# Patient Record
Sex: Male | Born: 1935 | Race: White | Hispanic: No | Smoking: Never smoker
Health system: Southern US, Community
[De-identification: ages and names within clinical notes are randomized; demographics above are authoritative.]

## PROBLEM LIST (undated history)

## (undated) DIAGNOSIS — H9193 Unspecified hearing loss, bilateral: Secondary | ICD-10-CM

## (undated) DIAGNOSIS — E785 Hyperlipidemia, unspecified: Secondary | ICD-10-CM

## (undated) DIAGNOSIS — I10 Essential (primary) hypertension: Secondary | ICD-10-CM

## (undated) DIAGNOSIS — R7303 Prediabetes: Secondary | ICD-10-CM

## (undated) DIAGNOSIS — N400 Enlarged prostate without lower urinary tract symptoms: Secondary | ICD-10-CM

## (undated) DIAGNOSIS — I4891 Unspecified atrial fibrillation: Secondary | ICD-10-CM

## (undated) DIAGNOSIS — D414 Neoplasm of uncertain behavior of bladder: Secondary | ICD-10-CM

## (undated) DIAGNOSIS — N320 Bladder-neck obstruction: Secondary | ICD-10-CM

## (undated) DIAGNOSIS — J189 Pneumonia, unspecified organism: Secondary | ICD-10-CM

## (undated) DIAGNOSIS — I2510 Atherosclerotic heart disease of native coronary artery without angina pectoris: Secondary | ICD-10-CM

## (undated) HISTORY — DX: Hyperlipidemia, unspecified: E78.5

## (undated) HISTORY — DX: Atherosclerotic heart disease of native coronary artery without angina pectoris: I25.10

## (undated) HISTORY — DX: Essential (primary) hypertension: I10

## (undated) HISTORY — PX: MOUTH SURGERY: SHX715

## (undated) HISTORY — PX: BUNIONECTOMY: SHX129

## (undated) HISTORY — PX: CORONARY ARTERY BYPASS GRAFT: SHX141

## (undated) HISTORY — PX: OTHER SURGICAL HISTORY: SHX169

---
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
## 1961-09-01 DIAGNOSIS — D414 Neoplasm of uncertain behavior of bladder: Secondary | ICD-10-CM

## 1961-09-01 HISTORY — DX: Neoplasm of uncertain behavior of bladder: D41.4

## 2010-09-01 HISTORY — PX: US ECHOCARDIOGRAPHY: HXRAD669

## 2010-09-01 HISTORY — PX: HERNIA REPAIR: SHX51

## 2011-08-04 ENCOUNTER — Ambulatory Visit (INDEPENDENT_AMBULATORY_CARE_PROVIDER_SITE_OTHER): Payer: Medicare Other | Admitting: Family Medicine

## 2011-08-04 ENCOUNTER — Encounter: Payer: Self-pay | Admitting: Family Medicine

## 2011-08-04 DIAGNOSIS — R7301 Impaired fasting glucose: Secondary | ICD-10-CM

## 2011-08-04 DIAGNOSIS — I10 Essential (primary) hypertension: Secondary | ICD-10-CM

## 2011-08-04 DIAGNOSIS — E785 Hyperlipidemia, unspecified: Secondary | ICD-10-CM | POA: Insufficient documentation

## 2011-08-04 DIAGNOSIS — I2510 Atherosclerotic heart disease of native coronary artery without angina pectoris: Secondary | ICD-10-CM

## 2011-08-04 MED ORDER — ATENOLOL-CHLORTHALIDONE 50-25 MG PO TABS: 1.0000 | ORAL_TABLET | Freq: Every day | ORAL | Status: DC

## 2011-08-04 MED ORDER — PRAVASTATIN SODIUM 40 MG PO TABS: 60.0000 mg | ORAL_TABLET | Freq: Every day | ORAL | Status: DC

## 2011-08-04 NOTE — Progress Notes (Signed)
  Subjective:    Patient ID: Brian Manning, male    DOB: 1928/01/30, 77 y.o.   MRN: [id number]  HPI Here to establish care.  Will continue to get care at [facility] with Dr. Francene Castle.  He wanted a local PCP as well.  No problems, continues to have mild BPH which causes night time awakenings.  States has had mildly enlarged prostate on multiple exams throughout the past years with no abnormality or nodularity.  Declines medical tx  And feels it does not impact his quality of life.  Take diuretic during the day and does fluid restrict before bed.   Review of Systems Gen:  No fever, chills, unexplained weight loss Throat:  No sore throat or dysphagia CV:  No chest pain, palpitations, PND, dyspnea on exertion, or edema Resp: No cough, dyspnea, wheezing Abd: No nausea, vomting, diarrhea, constipation, or change in bowel color, size, or caliber. MSK: no joint pain, myalgias SKIN: no rash, changing moles GU: No dysuria, hematuria Neuro:  No headache, numbness, weakness, tingling, syncope. Psych:  Screening for depression neg      Objective:   Physical Exam GEN: Alert & Oriented, No acute distress HEENT: [place]/AT. EOMI, PERRLA, no conjunctival injection or scleral icterus.  Bilateral tympanic membranes intact without erythema or effusion.  .  Nares without edema or rhinorrhea.  Oropharynx is without erythema or exudates.  No anterior or posterior cervical lymphadenopathy.  Neck supple no thyromegaly. CV:  Regular Rate & Rhythm, no murmur Respiratory:  Normal work of breathing, CTAB Abd:  + BS, soft, no tenderness to palpation Ext: no pre-tibial edema Neuro: CN 2-12 grossly intact.  Strength 5/5 upper and lower extremities.        Assessment & Plan:  Preventive care:  Never had a colonoscopy but after discussion will not pursue any screening at his age and likely life expectancy.  Also will not do PSA and declines DEXA.  He is UTD on vaccinations.  Tetanus 1008 Pneumo 2009 Flu  2012   Will continue to get care at [facility] as well.  Recommended follow-up every 6-12 months.

## 2011-08-04 NOTE — Patient Instructions (Signed)
Very nice to meet you Return for fasting labwork- no food or drink except water for 8 hours I will send you a letter in the mail if all is normal, otherwise will call you to discuss Follow-up in 6-12 months

## 2011-08-04 NOTE — Assessment & Plan Note (Signed)
Will check fasting labwork 

## 2011-08-04 NOTE — Assessment & Plan Note (Addendum)
Close to goal of 130/80 today ,reasonable given his age.  No changes in regimen.  Will check cr and electrolytes

## 2011-09-10 ENCOUNTER — Other Ambulatory Visit: Payer: Medicare Other

## 2011-09-10 DIAGNOSIS — E785 Hyperlipidemia, unspecified: Secondary | ICD-10-CM

## 2011-09-10 LAB — COMPREHENSIVE METABOLIC PANEL
AST: 17 U/L (ref 0–37)
Albumin: 4 g/dL (ref 3.5–5.2)
BUN: 18 mg/dL (ref 6–23)
CO2: 27 mEq/L (ref 19–32)
Calcium: 9.2 mg/dL (ref 8.4–10.5)
Chloride: 99 mEq/L (ref 96–112)
Creat: 0.95 mg/dL (ref 0.50–1.35)
Glucose, Bld: 119 mg/dL — ABNORMAL HIGH (ref 70–99)
Potassium: 3.6 mEq/L (ref 3.5–5.3)

## 2011-09-10 LAB — LIPID PANEL
Cholesterol: 125 mg/dL (ref 0–200)
HDL: 40 mg/dL (ref >39)
Triglycerides: 66 mg/dL (ref <150)

## 2011-09-10 NOTE — Progress Notes (Signed)
cmp and flp done today Brian Manning 

## 2011-09-11 ENCOUNTER — Encounter: Payer: Self-pay | Admitting: Family Medicine

## 2011-09-11 ENCOUNTER — Telehealth: Payer: Self-pay | Admitting: Family Medicine

## 2011-09-11 DIAGNOSIS — R7301 Impaired fasting glucose: Secondary | ICD-10-CM | POA: Insufficient documentation

## 2011-09-11 NOTE — Telephone Encounter (Signed)
Left message to call back if has questions about labs- i have sent a sumamry to him in the mail

## 2011-09-11 NOTE — Assessment & Plan Note (Signed)
Recheck in 6-12 months.  

## 2012-09-01 HISTORY — PX: OTHER SURGICAL HISTORY: SHX169

## 2014-06-13 ENCOUNTER — Ambulatory Visit (INDEPENDENT_AMBULATORY_CARE_PROVIDER_SITE_OTHER): Payer: Medicare Other | Admitting: Family Medicine

## 2014-06-13 ENCOUNTER — Encounter: Payer: Self-pay | Admitting: Family Medicine

## 2014-06-13 ENCOUNTER — Ambulatory Visit: Payer: Medicare Other | Admitting: Home Health Services

## 2014-06-13 VITALS — BP 129/78 | HR 58 | Temp 97.6°F | Wt 196.5 lb

## 2014-06-13 DIAGNOSIS — Z1211 Encounter for screening for malignant neoplasm of colon: Secondary | ICD-10-CM

## 2014-06-13 DIAGNOSIS — Z0000 Encounter for general adult medical examination without abnormal findings: Secondary | ICD-10-CM

## 2014-06-13 DIAGNOSIS — IMO0001 Reserved for inherently not codable concepts without codable children: Secondary | ICD-10-CM

## 2014-06-13 DIAGNOSIS — Z23 Encounter for immunization: Secondary | ICD-10-CM

## 2014-06-13 LAB — BASIC METABOLIC PANEL
BUN: 16 mg/dL (ref 6–23)
CHLORIDE: 101 meq/L (ref 96–112)
CO2: 29 mEq/L (ref 19–32)
CREATININE: 0.87 mg/dL (ref 0.50–1.35)
Calcium: 9.2 mg/dL (ref 8.4–10.5)
Glucose, Bld: 106 mg/dL — ABNORMAL HIGH (ref 70–99)
POTASSIUM: 4.2 meq/L (ref 3.5–5.3)
Sodium: 138 mEq/L (ref 135–145)

## 2014-06-13 LAB — LIPID PANEL
CHOLESTEROL: 106 mg/dL (ref 0–200)
HDL: 44 mg/dL (ref >39)
LDL CALC: 52 mg/dL (ref 0–99)
TRIGLYCERIDES: 51 mg/dL (ref <150)
Total CHOL/HDL Ratio: 2.4 Ratio
VLDL: 10 mg/dL (ref 0–40)

## 2014-06-13 NOTE — Patient Instructions (Signed)
Colonoscopy °A colonoscopy is an exam to look at your colon. This exam can help find lumps (tumors), growths (polyps), bleeding, and redness and puffiness (inflammation) in your colon.  °BEFORE THE PROCEDURE °· Ask your doctor about changing or stopping your regular medicines. °· You may need to drink a large amount of a special liquid (oral bowel prep). You start drinking this the day before your procedure. It will cause you to have watery poop (stool). This cleans out your colon. °· Do not eat or drink anything else once you have started the bowel prep, unless your doctor tells you it is safe to do so. °· Make plans for someone to drive you home after the procedure. °PROCEDURE °· You will be given medicine to help you relax (sedative). °· You will lie on your side with your knees bent. °· A tube with a camera on the end is put in the opening of your butt (anus) and into your colon. Pictures are sent to a computer screen. Your doctor will look for anything that is not normal. °· Your doctor may take a tissue sample (biopsy) from your colon to be looked at more closely. °· The exam is finished when your doctor has viewed all of the colon. °AFTER THE PROCEDURE °· Do not drive for 24 hours after the exam. °· You may have a small amount of blood in your poop. This is normal. °· You may pass gas and have belly (abdominal) cramps. This is normal. °· Ask when your test results will be ready. Make sure you get your test results. °Document Released: 09/20/2010 Document Revised: 08/23/2013 Document Reviewed: 04/25/2013 °ExitCare® Patient Information ©2015 [employer]. This information is not intended to replace advice given to you by your health care provider. Make sure you discuss any questions you have with your health care provider. ° °

## 2014-06-13 NOTE — Progress Notes (Signed)
Patient ID: Brian Manning, male   DOB: [birth year], 80 y.o.   MRN: [id number] Subjective:     Brian Manning is a 80 y.o. male and is here for a comprehensive physical exam. The patient reports Increase urine frequency was started on Cadura,helping some. Also right shoulder pain due to DJD for which he is scheduled for consultation Nov 18.  History   Social History  . Marital Status: Unknown    Spouse Name: N/A    Number of Children: N/A  . Years of Education: N/A   Occupational History  . retired    Social History Main Topics  . Smoking status: Not on file  . Smokeless tobacco: Never Used  . Alcohol Use: Not on file  . Drug Use: Not on file  . Sexual Activity: Not on file   Other Topics Concern  . Not on file   Social History Narrative   Retired Engineer, maintenance (IT).  Divorced- ex wife now passed away.  Lives alone- has two children in [place].  College graduate.   Health Maintenance  Topic Date Due  . Tetanus/tdap  12/24/1948  . Colonoscopy  12/25/1979  . Zostavax  12/24/1989  . Pneumococcal Polysaccharide Vaccine Age 105 And Over  12/25/1994  . Influenza Vaccine  04/01/2014    The following portions of the patient's history were reviewed and updated as appropriate: allergies, current medications, past family history, past medical history, past social history, past surgical history and problem list.  Review of Systems Pertinent items are noted in HPI.   Objective:    BP 129/78  Pulse 58  Temp(Src) 97.6 F (36.4 C)  Wt 196 lb 8 oz (89.132 kg) General appearance: alert and cooperative Head: Normocephalic, without obvious abnormality, atraumatic Eyes: conjunctivae/corneas clear. PERRL, EOM's intact. Fundi benign. Ears: normal TM's and external ear canals both ears Throat: lips, mucosa, and tongue normal; teeth and gums normal Neck: no adenopathy, no carotid bruit, no JVD, supple, symmetrical, trachea midline and thyroid not enlarged, symmetric, no  tenderness/mass/nodules Back: symmetric, no curvature. ROM normal. No CVA tenderness. Lungs: clear to auscultation bilaterally Heart: regular rate and rhythm, S1, S2 normal, no murmur, click, rub or gallop Abdomen: soft, non-tender; bowel sounds normal; no masses,  no organomegaly Rectal: deferred Extremities: extremities normal, atraumatic, no cyanosis or edema Skin: Skin color, texture, turgor normal. No rashes or lesions Lymph nodes: Cervical, supraclavicular, and axillary nodes normal. Neurologic: Alert and oriented X 3, normal strength and tone. Normal symmetric reflexes. Normal coordination and gait    Assessment:    Healthy male exam. Normal exam      Plan:  Anticipatory guidance given. Flu shot given. He mentioned he got Prevnar in Sept 2015 at the [place] Has never had colonoscopy, he prefers not to get it done now that he is over 82. Stool card given for occult blood testing. He agreed to bring his stool back for testing. F/U with ortho as scheduled for right shoulder pain BMET and Lipid profile checked today. F/U in 6-12 months.    See After Visit Summary for Counseling Recommendations

## 2014-06-13 NOTE — Progress Notes (Signed)
Patient here for annual wellness visit, patient reports: Risk Factors/Conditions needing evaluation or treatment: Pt. Does not have have any new risk factors that need evaluation.  Home Safety: Pt lives at home, by self in a 1 story home.  Pt reports having smoke alarms and having adaptive equipment.  Other Information: Corrective lens: Pt wears reading corrective lens, has biannual eye exams at [facility].  Dentures: Pt does not have dentures, has annual dental exams. Memory: Pt denies memory problems.  Patient's Mini Mental Score (recorded in doc. flowsheet): 30 Bladder:  Pt denies problems with bladder control.  BMI/Exercise: We discussed BMI and strategies for weight maintenance including maintaining diet rich in fiber and vegetables.  We also discussed  maintining a regular exercise routine. Pt reports walking 2 miles every other day. Med Adherence:  We discussed importance of taking medications for htn.  Pt reports missing 0 day in the past week.  ADL/IADL:  Pt reports independence in all functions. Balance/Gait: Pt reports 0 falls in the past year.  We discussed home safety and fall prevention.   Hearing:  Pt reports some problems with hearing in left ear, is not interested in getting a hearing aid.   Balance Abnormal Patient value  Sitting balance    Sit to stand    Attempts to arise    Immediate standing balance    Standing balance    Nudge    Eyes closed- Romberg    Tandem stance    Back lean x abnormal  Neck Rotation    360 degree turn    Sitting down     Gait Abnormal Patient value  Initiation of gait    Step length-left    Step length-right    Step height-left    Step height-right    Step symmetry    Step continuity    Path deviation    Trunk movement    Walking stance        Annual Wellness Visit Requirements Recorded Today In  Medical, family, social history Past Medical, Family, Social History Section  Current providers Care team  Current medications  Medications  Wt, BP, Ht, BMI Vital signs  Hearing assessment (welcome visit) Progress Note  Tobacco, alcohol, illicit drug use History  ADL Nurse Assessment  Depression Screening Nurse Assessment  Cognitive impairment Document Flowsheet  Mini Mental Status Document Flowsheet  Fall Risk Fall/Depression  Home Safety Progress Note  End of Life Planning (welcome visit) Social Documentation  Medicare preventative services Health Maintenance  Risk factors/conditions needing evaluation/treatment Progress Note  Personalized health advice Patient Instructions, goals, letter  Diet & Exercise Social Documentation  Emergency Contact Social Documentation  Seat Belts Social Documentation  Sun exposure/protection Social Documentation

## 2014-06-14 ENCOUNTER — Telehealth: Payer: Self-pay | Admitting: Family Medicine

## 2014-06-14 NOTE — Telephone Encounter (Signed)
Message left. Lab test looks ok.

## 2014-06-15 ENCOUNTER — Encounter: Payer: Self-pay | Admitting: Family Medicine

## 2014-06-27 LAB — POC HEMOCCULT BLD/STL (HOME/3-CARD/SCREEN)
FECAL OCCULT BLD: NEGATIVE
FECAL OCCULT BLD: NEGATIVE
Fecal Occult Blood, POC: NEGATIVE

## 2014-06-27 NOTE — Addendum Note (Signed)
Addended by: Martinique, Marque Bango on: 06/27/2014 11:52 AM   Modules accepted: Orders

## 2014-12-19 ENCOUNTER — Other Ambulatory Visit: Payer: Self-pay | Admitting: Family Medicine

## 2014-12-19 ENCOUNTER — Telehealth: Payer: Self-pay | Admitting: Family Medicine

## 2014-12-19 DIAGNOSIS — M25511 Pain in right shoulder: Secondary | ICD-10-CM

## 2014-12-19 NOTE — Telephone Encounter (Signed)
After looking through chart there was no referral placed for ortho.  Will also send this message to MD to place referral. Brian Manning

## 2014-12-19 NOTE — Telephone Encounter (Signed)
Referral to orthopedic done.

## 2014-12-19 NOTE — Telephone Encounter (Signed)
Pt calling re: an upcoming surgery that is scheduled for shoulder replacement on 5/6. Pt has [place]. Dr. Esmond Plants with [facility] is doing the surgery. They need a referral from Dr. Gwendlyn Deutscher in order to do the surgery. Pt says he has been waiting for a year to do this surgery and would hate to have to post pone it any longer. Wants to know if we can process this asap with [facility], he says he is running out of time. Pt says we can speak to [place] at [place] at [phone]. Pt originally thought he could file this with the [facility] but was told it wouldn't be covered at [facility] so that is why this is so last minute.

## 2014-12-19 NOTE — Telephone Encounter (Signed)
Will forward to referral coordinator. Jazmin Hartsell,CMA  

## 2014-12-20 NOTE — Telephone Encounter (Signed)
[employer] referral done.

## 2014-12-26 NOTE — H&P (Signed)
  Brian Manning is an 81 y.o. male.    Chief Complaint: right shoulder pain  HPI: Pt is a 81 y.o. male complaining of right shoulder pain for multiple years. Pain had continually increased since the beginning. X-rays in the clinic show end-stage arthritic changes of the right shoulder. Pt has tried various conservative treatments which have failed to alleviate their symptoms, including injections and therapy. Various options are discussed with the patient. Risks, benefits and expectations were discussed with the patient. Patient understand the risks, benefits and expectations and wishes to proceed with surgery.   PCP:  Andrena Mews, MD  D/C Plans: Home  PMH: Past Medical History  Diagnosis Date  . Hyperlipidemia   . Hypertension   . Coronary artery disease     PSH: Past Surgical History  Procedure Laterality Date  . Hernia repair  2012  . US echocardiography  2012    pre-op for hernia: wnl  per patient  . Bladder polyp    . Left shoulder replacement Left 2014    Done at [place] History:  reports that he has never smoked. He has never used smokeless tobacco. He reports that he drinks about 4.2 oz of alcohol per week. He reports that he does not use illicit drugs.  Allergies:  No Known Allergies  Medications: No current facility-administered medications for this encounter.   Current Outpatient Prescriptions  Medication Sig Dispense Refill  . aspirin EC 81 MG tablet Take 81 mg by mouth daily.      Marland Kitchen atenolol (TENORMIN) 50 MG tablet Take 50 mg by mouth daily.    Marland Kitchen doxazosin (CARDURA) 2 MG tablet Take 2 mg by mouth daily.    . fish oil-omega-3 fatty acids 1000 MG capsule Take 1 g by mouth daily.     . folic acid (FOLVITE) 128 MCG tablet Take 400 mcg by mouth daily.      . Multiple Vitamin (MULTIVITAMIN) tablet Take 1 tablet by mouth daily.      . naproxen sodium (ANAPROX) 220 MG tablet Take 220 mg by mouth 2 (two) times daily as needed (pain).     . pravastatin  (PRAVACHOL) 40 MG tablet Take 40 mg by mouth daily.      No results found for this or any previous visit (from the past 48 hour(s)). No results found.  ROS: Pain with rom of the right upper extremity  Physical Exam:  Alert and oriented 81 y.o. male in no acute distress Cranial nerves 2-12 intact Cervical spine: full rom with no tenderness, nv intact distally Chest: active breath sounds bilaterally, no wheeze rhonchi or rales Heart: regular rate and rhythm, no murmur Abd: non tender non distended with active bowel sounds Hip is stable with rom  Right shoulder with limited rom and strength nv intact distally No rashes or edema Crepitus with rom  Assessment/Plan Assessment: right shoulder rotator cuff insufficiency  Plan: Patient will undergo a right reverse total shoulder by Dr. Veverly Fells at [facility]. Risks benefits and expectations were discussed with the patient. Patient understand risks, benefits and expectations and wishes to proceed.

## 2014-12-28 ENCOUNTER — Encounter (HOSPITAL_COMMUNITY)
Admission: RE | Admit: 2014-12-28 | Discharge: 2014-12-28 | Disposition: A | Discharge status: Home / Self Care | Payer: Commercial Managed Care - HMO | Source: Ambulatory Visit | Attending: Orthopedic Surgery | Admitting: Orthopedic Surgery

## 2014-12-28 ENCOUNTER — Encounter (HOSPITAL_COMMUNITY): Payer: Self-pay

## 2014-12-28 DIAGNOSIS — I10 Essential (primary) hypertension: Secondary | ICD-10-CM | POA: Insufficient documentation

## 2014-12-28 DIAGNOSIS — E785 Hyperlipidemia, unspecified: Secondary | ICD-10-CM | POA: Insufficient documentation

## 2014-12-28 DIAGNOSIS — Z01812 Encounter for preprocedural laboratory examination: Secondary | ICD-10-CM | POA: Insufficient documentation

## 2014-12-28 LAB — CBC
HCT: 42 % (ref 39.0–52.0)
HEMOGLOBIN: 14.4 g/dL (ref 13.0–17.0)
MCH: 32.7 pg (ref 26.0–34.0)
MCHC: 34.3 g/dL (ref 30.0–36.0)
MCV: 95.2 fL (ref 78.0–100.0)
Platelets: 199 10*3/uL (ref 150–400)
RBC: 4.41 MIL/uL (ref 4.22–5.81)
RDW: 12.8 % (ref 11.5–15.5)
WBC: 4.9 10*3/uL (ref 4.0–10.5)

## 2014-12-28 LAB — BASIC METABOLIC PANEL
ANION GAP: 6 (ref 5–15)
BUN: 17 mg/dL (ref 6–23)
CALCIUM: 9.1 mg/dL (ref 8.4–10.5)
CO2: 27 mmol/L (ref 19–32)
CREATININE: 0.87 mg/dL (ref 0.50–1.35)
Chloride: 103 mmol/L (ref 96–112)
GFR calc non Af Amer: 77 mL/min — ABNORMAL LOW (ref >90)
GFR, EST AFRICAN AMERICAN: 89 mL/min — AB (ref >90)
Glucose, Bld: 110 mg/dL — ABNORMAL HIGH (ref 70–99)
Potassium: 4.9 mmol/L (ref 3.5–5.1)
SODIUM: 136 mmol/L (ref 135–145)

## 2014-12-28 LAB — ABO/RH: ABO/RH(D): A POS

## 2014-12-28 LAB — TYPE AND SCREEN
ABO/RH(D): A POS
ANTIBODY SCREEN: NEGATIVE

## 2014-12-28 LAB — SURGICAL PCR SCREEN
MRSA, PCR: NEGATIVE
Staphylococcus aureus: NEGATIVE

## 2014-12-28 NOTE — Progress Notes (Signed)
[facility] . Requested cardiac clearance and ekg. Dr. Tammi Klippel

## 2014-12-28 NOTE — Progress Notes (Addendum)
Anesthesia Chart Review:  Pt is 81 year old male scheduled for R reverse shoulder arthroplasty on 01/05/2015 with Dr. Veverly Fells.   PCP is Dr. Andrena Mews at [facility]. Also sees Dr. Tammi Klippel in internal medicine at [facility].    PMH includes: CAD (s/p CABG 1982), HTN, hyperlipidemia. Never smoker. BMI 27.   Medications include: ASA, atenolol, doxazosin, pravachol.   Preoperative labs reviewed.    EKG 09/26/2014: Sinus bradycardia (56 bpm).   Pt has medical clearance from Dr. Tammi Klippel.   If no changes, I anticipate pt can proceed with surgery as scheduled.   Willeen Cass, FNP-BC [facility]/Anesthesiology Phone: [phone] 01/01/2015 1:03 PM

## 2014-12-28 NOTE — Pre-Procedure Instructions (Signed)
Tarance BENOIT MEECH  12/28/2014   Your procedure is scheduled on:  01/05/15  Report to [facility] Admitting at 530 AM.  Call this number if you have problems the morning of surgery: [phone]   Remember:   Do not eat food or drink liquids after midnight.   Take these medicines the morning of surgery with A SIP OF WATER: tenormin,cardura   Do not wear jewelry, make-up or nail polish.  Do not wear lotions, powders, or perfumes. You may wear deodorant.  Do not shave 48 hours prior to surgery. Men may shave face and neck.  Do not bring valuables to the hospital.  [facility] is not responsible                  for any belongings or valuables.               Contacts, dentures or bridgework may not be worn into surgery.  Leave suitcase in the car. After surgery it may be brought to your room.  For patients admitted to the hospital, discharge time is determined by your                treatment team.               Patients discharged the day of surgery will not be allowed to drive  home.  Name and phone number of your driver: family  Special Instructions: Shower using CHG 2 nights before surgery and the night before surgery.  If you shower the day of surgery use CHG.  Use special wash - you have one bottle of CHG for all showers.  You should use approximately 1/3 of the bottle for each shower.   Please read over the following fact sheets that you were given: Pain Booklet, Coughing and Deep Breathing, MRSA Information and Surgical Site Infection Prevention

## 2015-01-04 MED ORDER — CEFAZOLIN SODIUM-DEXTROSE 2-3 GM-% IV SOLR
2.0000 g | INTRAVENOUS | Status: AC
  Administered 2015-01-05: 2 g via INTRAVENOUS

## 2015-01-04 MED ORDER — CHLORHEXIDINE GLUCONATE 4 % EX LIQD
60.0000 mL | CUTANEOUS | Status: DC
  Filled 2015-01-04: qty 60

## 2015-01-05 ENCOUNTER — Inpatient Hospital Stay (HOSPITAL_COMMUNITY): Payer: Commercial Managed Care - HMO | Admitting: Certified Registered"

## 2015-01-05 ENCOUNTER — Inpatient Hospital Stay (HOSPITAL_COMMUNITY): Payer: Commercial Managed Care - HMO | Admitting: Emergency Medicine

## 2015-01-05 ENCOUNTER — Encounter (HOSPITAL_COMMUNITY)
Admission: RE | Disposition: A | Discharge status: Home / Self Care | Payer: Self-pay | Source: Ambulatory Visit | Attending: Orthopedic Surgery

## 2015-01-05 ENCOUNTER — Inpatient Hospital Stay (HOSPITAL_COMMUNITY)
Admission: RE | Admit: 2015-01-05 | Discharge: 2015-01-06 | DRG: 483 | Disposition: A | Discharge status: Home / Self Care | Payer: Commercial Managed Care - HMO | Source: Ambulatory Visit | Attending: Orthopedic Surgery | Admitting: Orthopedic Surgery

## 2015-01-05 ENCOUNTER — Inpatient Hospital Stay (HOSPITAL_COMMUNITY): Payer: Commercial Managed Care - HMO

## 2015-01-05 ENCOUNTER — Encounter (HOSPITAL_COMMUNITY): Payer: Self-pay | Admitting: *Deleted

## 2015-01-05 DIAGNOSIS — M25511 Pain in right shoulder: Secondary | ICD-10-CM | POA: Diagnosis not present

## 2015-01-05 DIAGNOSIS — M19011 Primary osteoarthritis, right shoulder: Principal | ICD-10-CM | POA: Diagnosis present

## 2015-01-05 DIAGNOSIS — Z96612 Presence of left artificial shoulder joint: Secondary | ICD-10-CM | POA: Diagnosis present

## 2015-01-05 DIAGNOSIS — I10 Essential (primary) hypertension: Secondary | ICD-10-CM | POA: Diagnosis present

## 2015-01-05 DIAGNOSIS — Z471 Aftercare following joint replacement surgery: Secondary | ICD-10-CM | POA: Diagnosis not present

## 2015-01-05 DIAGNOSIS — I2510 Atherosclerotic heart disease of native coronary artery without angina pectoris: Secondary | ICD-10-CM | POA: Diagnosis not present

## 2015-01-05 DIAGNOSIS — Z7982 Long term (current) use of aspirin: Secondary | ICD-10-CM

## 2015-01-05 DIAGNOSIS — E785 Hyperlipidemia, unspecified: Secondary | ICD-10-CM | POA: Diagnosis not present

## 2015-01-05 DIAGNOSIS — Z96619 Presence of unspecified artificial shoulder joint: Secondary | ICD-10-CM

## 2015-01-05 DIAGNOSIS — Z96611 Presence of right artificial shoulder joint: Secondary | ICD-10-CM

## 2015-01-05 DIAGNOSIS — G8918 Other acute postprocedural pain: Secondary | ICD-10-CM | POA: Diagnosis not present

## 2015-01-05 HISTORY — PX: REVERSE SHOULDER ARTHROPLASTY: SHX5054

## 2015-01-05 SURGERY — ARTHROPLASTY, SHOULDER, TOTAL, REVERSE
Anesthesia: Regional | Site: Shoulder | Laterality: Right

## 2015-01-05 MED ORDER — BUPIVACAINE-EPINEPHRINE 0.25% -1:200000 IJ SOLN
INTRAMUSCULAR | Status: DC | PRN
  Administered 2015-01-05: 9 mL

## 2015-01-05 MED ORDER — FENTANYL CITRATE (PF) 100 MCG/2ML IJ SOLN
INTRAMUSCULAR | Status: DC | PRN
  Administered 2015-01-05 (×2): 50 ug via INTRAVENOUS

## 2015-01-05 MED ORDER — ATENOLOL 50 MG PO TABS
50.0000 mg | ORAL_TABLET | Freq: Every day | ORAL | Status: DC
  Administered 2015-01-06: 50 mg via ORAL
  Filled 2015-01-05: qty 1

## 2015-01-05 MED ORDER — OXYCODONE HCL 5 MG PO TABS: 5.0000 mg | ORAL_TABLET | Freq: Once | ORAL | Status: DC | PRN

## 2015-01-05 MED ORDER — METOCLOPRAMIDE HCL 5 MG/ML IJ SOLN: 5.0000 mg | Freq: Three times a day (TID) | INTRAMUSCULAR | Status: DC | PRN

## 2015-01-05 MED ORDER — ASPIRIN EC 81 MG PO TBEC
81.0000 mg | DELAYED_RELEASE_TABLET | Freq: Every day | ORAL | Status: DC
  Administered 2015-01-06: 81 mg via ORAL
  Filled 2015-01-05: qty 1

## 2015-01-05 MED ORDER — SUCCINYLCHOLINE CHLORIDE 20 MG/ML IJ SOLN
INTRAMUSCULAR | Status: AC
  Filled 2015-01-05: qty 1

## 2015-01-05 MED ORDER — ACETAMINOPHEN 325 MG PO TABS: 650.0000 mg | ORAL_TABLET | Freq: Four times a day (QID) | ORAL | Status: DC | PRN

## 2015-01-05 MED ORDER — BISACODYL 10 MG RE SUPP: 10.0000 mg | Freq: Every day | RECTAL | Status: DC | PRN

## 2015-01-05 MED ORDER — DOXAZOSIN MESYLATE 2 MG PO TABS
2.0000 mg | ORAL_TABLET | Freq: Every day | ORAL | Status: DC
  Filled 2015-01-05: qty 1

## 2015-01-05 MED ORDER — SODIUM CHLORIDE 0.9 % IV SOLN
INTRAVENOUS | Status: DC
  Administered 2015-01-05: 20:00:00 via INTRAVENOUS

## 2015-01-05 MED ORDER — ROCURONIUM BROMIDE 100 MG/10ML IV SOLN
INTRAVENOUS | Status: DC | PRN
  Administered 2015-01-05: 40 mg via INTRAVENOUS

## 2015-01-05 MED ORDER — OMEGA-3 FATTY ACIDS 1000 MG PO CAPS: 1.0000 g | ORAL_CAPSULE | Freq: Every day | ORAL | Status: DC

## 2015-01-05 MED ORDER — FOLIC ACID 1 MG PO TABS
0.5000 mg | ORAL_TABLET | Freq: Every day | ORAL | Status: DC
  Administered 2015-01-05 – 2015-01-06 (×2): 0.5 mg via ORAL
  Filled 2015-01-05 (×2): qty 1

## 2015-01-05 MED ORDER — FOLIC ACID 800 MCG PO TABS: 400.0000 ug | ORAL_TABLET | Freq: Every day | ORAL | Status: DC

## 2015-01-05 MED ORDER — ONDANSETRON HCL 4 MG PO TABS: 4.0000 mg | ORAL_TABLET | Freq: Four times a day (QID) | ORAL | Status: DC | PRN

## 2015-01-05 MED ORDER — PHENYLEPHRINE HCL 10 MG/ML IJ SOLN
10.0000 mg | INTRAVENOUS | Status: DC | PRN
  Administered 2015-01-05: 30 ug/min via INTRAVENOUS

## 2015-01-05 MED ORDER — OMEGA-3-ACID ETHYL ESTERS 1 G PO CAPS
1.0000 g | ORAL_CAPSULE | Freq: Every day | ORAL | Status: DC
  Administered 2015-01-05 – 2015-01-06 (×2): 1 g via ORAL
  Filled 2015-01-05 (×2): qty 1

## 2015-01-05 MED ORDER — HYDROMORPHONE HCL 1 MG/ML IJ SOLN: 1.0000 mg | INTRAMUSCULAR | Status: DC | PRN

## 2015-01-05 MED ORDER — DEXAMETHASONE SODIUM PHOSPHATE 4 MG/ML IJ SOLN
INTRAMUSCULAR | Status: AC
  Filled 2015-01-05: qty 1

## 2015-01-05 MED ORDER — ONDANSETRON HCL 4 MG/2ML IJ SOLN
INTRAMUSCULAR | Status: DC | PRN
  Administered 2015-01-05: 4 mg via INTRAVENOUS

## 2015-01-05 MED ORDER — METHOCARBAMOL 1000 MG/10ML IJ SOLN
500.0000 mg | Freq: Four times a day (QID) | INTRAVENOUS | Status: DC | PRN
  Filled 2015-01-05: qty 5

## 2015-01-05 MED ORDER — ONDANSETRON HCL 4 MG/2ML IJ SOLN: 4.0000 mg | Freq: Four times a day (QID) | INTRAMUSCULAR | Status: DC | PRN

## 2015-01-05 MED ORDER — ALBUMIN HUMAN 5 % IV SOLN
INTRAVENOUS | Status: DC | PRN
Start: 2015-01-05 — End: 2015-01-05
  Administered 2015-01-05: 09:00:00 via INTRAVENOUS

## 2015-01-05 MED ORDER — PROPOFOL 10 MG/ML IV BOLUS
INTRAVENOUS | Status: AC
  Filled 2015-01-05: qty 20

## 2015-01-05 MED ORDER — PRAVASTATIN SODIUM 40 MG PO TABS
40.0000 mg | ORAL_TABLET | Freq: Every day | ORAL | Status: DC
  Administered 2015-01-05: 40 mg via ORAL
  Filled 2015-01-05 (×2): qty 1

## 2015-01-05 MED ORDER — PROPOFOL 10 MG/ML IV BOLUS
INTRAVENOUS | Status: DC | PRN
  Administered 2015-01-05: 140 mg via INTRAVENOUS
  Administered 2015-01-05: 20 mg via INTRAVENOUS

## 2015-01-05 MED ORDER — ADULT MULTIVITAMIN W/MINERALS CH
1.0000 | ORAL_TABLET | Freq: Every day | ORAL | Status: DC
  Administered 2015-01-05 – 2015-01-06 (×2): 1 via ORAL
  Filled 2015-01-05 (×2): qty 1

## 2015-01-05 MED ORDER — MENTHOL 3 MG MT LOZG: 1.0000 | LOZENGE | OROMUCOSAL | Status: DC | PRN

## 2015-01-05 MED ORDER — ONE-DAILY MULTI VITAMINS PO TABS: 1.0000 | ORAL_TABLET | Freq: Every day | ORAL | Status: DC

## 2015-01-05 MED ORDER — METOCLOPRAMIDE HCL 5 MG PO TABS: 5.0000 mg | ORAL_TABLET | Freq: Three times a day (TID) | ORAL | Status: DC | PRN

## 2015-01-05 MED ORDER — LACTATED RINGERS IV SOLN
INTRAVENOUS | Status: DC | PRN
Start: 2015-01-05 — End: 2015-01-05
  Administered 2015-01-05 (×2): via INTRAVENOUS

## 2015-01-05 MED ORDER — 0.9 % SODIUM CHLORIDE (POUR BTL) OPTIME
TOPICAL | Status: DC | PRN
  Administered 2015-01-05: 1000 mL

## 2015-01-05 MED ORDER — LIDOCAINE HCL (CARDIAC) 20 MG/ML IV SOLN
INTRAVENOUS | Status: AC
  Filled 2015-01-05: qty 5

## 2015-01-05 MED ORDER — PHENOL 1.4 % MT LIQD: 1.0000 | OROMUCOSAL | Status: DC | PRN

## 2015-01-05 MED ORDER — POLYETHYLENE GLYCOL 3350 17 G PO PACK: 17.0000 g | PACK | Freq: Every day | ORAL | Status: DC | PRN

## 2015-01-05 MED ORDER — OXYCODONE HCL 5 MG/5ML PO SOLN: 5.0000 mg | Freq: Once | ORAL | Status: DC | PRN

## 2015-01-05 MED ORDER — CEFAZOLIN SODIUM-DEXTROSE 2-3 GM-% IV SOLR
2.0000 g | Freq: Four times a day (QID) | INTRAVENOUS | Status: AC
  Administered 2015-01-05 – 2015-01-06 (×2): 2 g via INTRAVENOUS
  Filled 2015-01-05 (×3): qty 50

## 2015-01-05 MED ORDER — LIDOCAINE HCL (CARDIAC) 20 MG/ML IV SOLN
INTRAVENOUS | Status: DC | PRN
  Administered 2015-01-05: 70 mg via INTRAVENOUS

## 2015-01-05 MED ORDER — MIDAZOLAM HCL 5 MG/5ML IJ SOLN
INTRAMUSCULAR | Status: DC | PRN
  Administered 2015-01-05: 1 mg via INTRAVENOUS

## 2015-01-05 MED ORDER — ASPIRIN EC 81 MG PO TBEC: 81.0000 mg | DELAYED_RELEASE_TABLET | Freq: Every day | ORAL | Status: DC

## 2015-01-05 MED ORDER — BUPIVACAINE-EPINEPHRINE (PF) 0.5% -1:200000 IJ SOLN
INTRAMUSCULAR | Status: DC | PRN
  Administered 2015-01-05: 30 mL via PERINEURAL

## 2015-01-05 MED ORDER — FENTANYL CITRATE (PF) 100 MCG/2ML IJ SOLN: 25.0000 ug | INTRAMUSCULAR | Status: DC | PRN

## 2015-01-05 MED ORDER — BUPIVACAINE-EPINEPHRINE (PF) 0.25% -1:200000 IJ SOLN
INTRAMUSCULAR | Status: AC
  Filled 2015-01-05: qty 30

## 2015-01-05 MED ORDER — OXYCODONE HCL 5 MG PO TABS
5.0000 mg | ORAL_TABLET | ORAL | Status: DC | PRN
  Administered 2015-01-06 (×3): 5 mg via ORAL
  Filled 2015-01-05: qty 1
  Filled 2015-01-05: qty 2
  Filled 2015-01-05: qty 1

## 2015-01-05 MED ORDER — METHOCARBAMOL 500 MG PO TABS
500.0000 mg | ORAL_TABLET | Freq: Four times a day (QID) | ORAL | Status: DC | PRN
  Administered 2015-01-06: 500 mg via ORAL
  Filled 2015-01-05: qty 1

## 2015-01-05 MED ORDER — ACETAMINOPHEN 650 MG RE SUPP: 650.0000 mg | Freq: Four times a day (QID) | RECTAL | Status: DC | PRN

## 2015-01-05 MED ORDER — ROCURONIUM BROMIDE 50 MG/5ML IV SOLN
INTRAVENOUS | Status: AC
  Filled 2015-01-05: qty 1

## 2015-01-05 MED ORDER — MIDAZOLAM HCL 2 MG/2ML IJ SOLN
INTRAMUSCULAR | Status: AC
  Filled 2015-01-05: qty 2

## 2015-01-05 MED ORDER — DEXAMETHASONE SODIUM PHOSPHATE 4 MG/ML IJ SOLN
INTRAMUSCULAR | Status: DC | PRN
  Administered 2015-01-05: 4 mg via INTRAVENOUS

## 2015-01-05 MED ORDER — FENTANYL CITRATE (PF) 250 MCG/5ML IJ SOLN
INTRAMUSCULAR | Status: AC
  Filled 2015-01-05: qty 5

## 2015-01-05 SURGICAL SUPPLY — 71 items
BIT DRILL 170X2.5X (BIT) ×1 IMPLANT
BIT DRILL 5/64X5 DISP (BIT) ×3 IMPLANT
BIT DRL 170X2.5X (BIT) ×1
BLADE SAG 18X100X1.27 (BLADE) ×3 IMPLANT
CAPT SHLDR REVTOTAL 1 ×3 IMPLANT
CLOSURE WOUND 1/2 X4 (GAUZE/BANDAGES/DRESSINGS) ×1
COVER SURGICAL LIGHT HANDLE (MISCELLANEOUS) ×3 IMPLANT
DRAPE IMP U-DRAPE 54X76 (DRAPES) ×6 IMPLANT
DRAPE INCISE IOBAN 66X45 STRL (DRAPES) ×3 IMPLANT
DRAPE ORTHO SPLIT 77X108 STRL (DRAPES) ×4
DRAPE SURG ORHT 6 SPLT 77X108 (DRAPES) ×2 IMPLANT
DRAPE U-SHAPE 47X51 STRL (DRAPES) ×3 IMPLANT
DRAPE X-RAY CASS 24X20 (DRAPES) IMPLANT
DRILL 2.5 (BIT) ×2
DRSG ADAPTIC 3X8 NADH LF (GAUZE/BANDAGES/DRESSINGS) ×3 IMPLANT
DRSG PAD ABDOMINAL 8X10 ST (GAUZE/BANDAGES/DRESSINGS) ×3 IMPLANT
DURAPREP 26ML APPLICATOR (WOUND CARE) ×3 IMPLANT
ELECT BLADE 4.0 EZ CLEAN MEGAD (MISCELLANEOUS) ×3
ELECT NEEDLE TIP 2.8 STRL (NEEDLE) ×3 IMPLANT
ELECT REM PT RETURN 9FT ADLT (ELECTROSURGICAL) ×3
ELECTRODE BLDE 4.0 EZ CLN MEGD (MISCELLANEOUS) ×1 IMPLANT
ELECTRODE REM PT RTRN 9FT ADLT (ELECTROSURGICAL) ×1 IMPLANT
GAUZE SPONGE 4X4 12PLY STRL (GAUZE/BANDAGES/DRESSINGS) ×3 IMPLANT
GLOVE BIO SURGEON STRL SZ8.5 (GLOVE) ×3 IMPLANT
GLOVE BIOGEL PI IND STRL 8.5 (GLOVE) ×1 IMPLANT
GLOVE BIOGEL PI INDICATOR 8.5 (GLOVE) ×2
GLOVE BIOGEL PI ORTHO PRO 7.5 (GLOVE) ×2
GLOVE BIOGEL PI ORTHO PRO SZ8 (GLOVE) ×2
GLOVE ORTHO TXT STRL SZ7.5 (GLOVE) ×3 IMPLANT
GLOVE PI ORTHO PRO STRL 7.5 (GLOVE) ×1 IMPLANT
GLOVE PI ORTHO PRO STRL SZ8 (GLOVE) ×1 IMPLANT
GLOVE SURG ORTHO 8.5 STRL (GLOVE) ×6 IMPLANT
GOWN STRL REUS W/ TWL LRG LVL3 (GOWN DISPOSABLE) IMPLANT
GOWN STRL REUS W/ TWL XL LVL3 (GOWN DISPOSABLE) ×4 IMPLANT
GOWN STRL REUS W/TWL LRG LVL3 (GOWN DISPOSABLE)
GOWN STRL REUS W/TWL XL LVL3 (GOWN DISPOSABLE) ×8
HANDPIECE INTERPULSE COAX TIP (DISPOSABLE)
KIT BASIN OR (CUSTOM PROCEDURE TRAY) ×3 IMPLANT
KIT ROOM TURNOVER OR (KITS) ×3 IMPLANT
MANIFOLD NEPTUNE II (INSTRUMENTS) ×3 IMPLANT
MARKER SKIN DUAL TIP RULER LAB (MISCELLANEOUS) ×3 IMPLANT
NEEDLE 1/2 CIR MAYO (NEEDLE) ×3 IMPLANT
NEEDLE HYPO 25GX1X1/2 BEV (NEEDLE) ×3 IMPLANT
NS IRRIG 1000ML POUR BTL (IV SOLUTION) ×6 IMPLANT
PACK SHOULDER (CUSTOM PROCEDURE TRAY) ×3 IMPLANT
PAD ARMBOARD 7.5X6 YLW CONV (MISCELLANEOUS) ×6 IMPLANT
PIN GUIDE 1.2 (PIN) ×3 IMPLANT
PIN GUIDE GLENOPHERE 1.5MX300M (PIN) ×3 IMPLANT
PIN METAGLENE 2.5 (PIN) ×3 IMPLANT
SCREW 48L (Screw) ×3 IMPLANT
SET HNDPC FAN SPRY TIP SCT (DISPOSABLE) IMPLANT
SLING ARM FOAM STRAP LRG (SOFTGOODS) ×3 IMPLANT
SLING ARM MED ADULT FOAM STRAP (SOFTGOODS) IMPLANT
SPONGE LAP 18X18 X RAY DECT (DISPOSABLE) IMPLANT
SPONGE LAP 4X18 X RAY DECT (DISPOSABLE) ×3 IMPLANT
STRIP CLOSURE SKIN 1/2X4 (GAUZE/BANDAGES/DRESSINGS) ×2 IMPLANT
SUCTION FRAZIER TIP 10 FR DISP (SUCTIONS) ×3 IMPLANT
SUT FIBERWIRE #2 38 T-5 BLUE (SUTURE) ×18
SUT MNCRL AB 4-0 PS2 18 (SUTURE) ×3 IMPLANT
SUT VIC AB 2-0 CT1 27 (SUTURE) ×4
SUT VIC AB 2-0 CT1 TAPERPNT 27 (SUTURE) ×2 IMPLANT
SUT VICRYL 0 CT 1 36IN (SUTURE) ×3 IMPLANT
SUTURE FIBERWR #2 38 T-5 BLUE (SUTURE) ×6 IMPLANT
SYR CONTROL 10ML LL (SYRINGE) ×3 IMPLANT
TAPE CLOTH SURG 6X10 WHT LF (GAUZE/BANDAGES/DRESSINGS) ×3 IMPLANT
TOWEL OR 17X24 6PK STRL BLUE (TOWEL DISPOSABLE) ×3 IMPLANT
TOWEL OR 17X26 10 PK STRL BLUE (TOWEL DISPOSABLE) ×3 IMPLANT
TOWER CARTRIDGE SMART MIX (DISPOSABLE) IMPLANT
TRAY FOLEY CATH 16FRSI W/METER (SET/KITS/TRAYS/PACK) IMPLANT
WATER STERILE IRR 1000ML POUR (IV SOLUTION) ×3 IMPLANT
YANKAUER SUCT BULB TIP NO VENT (SUCTIONS) ×3 IMPLANT

## 2015-01-05 NOTE — Brief Op Note (Signed)
01/05/2015  10:46 AM  PATIENT:  Brian Manning  81 y.o. male  PRE-OPERATIVE DIAGNOSIS:  RIGHT SHOULDER OSTEOARTHRITIS, ROTATOR CUFF INSUFFICIENCY  POST-OPERATIVE DIAGNOSIS:  RIGHT SHOULDER OSTEOARTHRITIS, ROTATOR CUFF INSUFFICIENCY  PROCEDURE:  Procedure(s): RIGHT REVERSE SHOULDER ARTHROPLASTY (Right) DePuy Delta XTEND  SURGEON:  Surgeon(s) and Role:    * Netta Cedars, MD - Primary  PHYSICIAN ASSISTANT:   ASSISTANTS: Ventura Bruns, PA-C   ANESTHESIA:   regional and general  EBL:  Total I/O In: 1450 [I.V.:1200; IV Piggyback:250] Out: 200 [Blood:200]  BLOOD ADMINISTERED:none  DRAINS: none   LOCAL MEDICATIONS USED:  MARCAINE     SPECIMEN:  No Specimen  DISPOSITION OF SPECIMEN:  N/A  COUNTS:  YES  TOURNIQUET:  * No tourniquets in log *  DICTATION: .Other Dictation: Dictation Number [id number]  PLAN OF CARE: Admit to inpatient   PATIENT DISPOSITION:  PACU - hemodynamically stable.   Delay start of Pharmacological VTE agent (>24hrs) due to surgical blood loss or risk of bleeding: yes

## 2015-01-05 NOTE — Progress Notes (Signed)
Utilization review completed.  

## 2015-01-05 NOTE — Anesthesia Preprocedure Evaluation (Addendum)
Anesthesia Evaluation  Patient identified by MRN, date of birth, ID band Patient awake    Reviewed: Allergy & Precautions, NPO status , Patient's Chart, lab work & pertinent test results  Airway Mallampati: II  TM Distance: >3 FB Neck ROM: full    Dental  (+) Teeth Intact, Dental Advisory Given   Pulmonary neg pulmonary ROS,  breath sounds clear to auscultation        Cardiovascular hypertension, Pt. on medications and Pt. on home beta blockers + CAD and + CABG Rhythm:regular Rate:Normal     Neuro/Psych    GI/Hepatic   Endo/Other    Renal/GU      Musculoskeletal   Abdominal   Peds  Hematology   Anesthesia Other Findings   Reproductive/Obstetrics                            Anesthesia Physical Anesthesia Plan  ASA: III  Anesthesia Plan: General and Regional   Post-op Pain Management: MAC Combined w/ Regional for Post-op pain   Induction: Intravenous  Airway Management Planned: Oral ETT  Additional Equipment:   Intra-op Plan:   Post-operative Plan: Extubation in OR  Informed Consent: I have reviewed the patients History and Physical, chart, labs and discussed the procedure including the risks, benefits and alternatives for the proposed anesthesia with the patient or authorized representative who has indicated his/her understanding and acceptance.   Dental advisory given  Plan Discussed with: CRNA, Anesthesiologist and Surgeon  Anesthesia Plan Comments:        Anesthesia Quick Evaluation

## 2015-01-05 NOTE — Transfer of Care (Signed)
Immediate Anesthesia Transfer of Care Note  Patient: Brian Manning  Procedure(s) Performed: Procedure(s): RIGHT REVERSE SHOULDER ARTHROPLASTY (Right)  Patient Location: PACU  Anesthesia Type:GA combined with regional for post-op pain  Level of Consciousness: awake  Airway & Oxygen Therapy: Patient Spontanous Breathing and Patient connected to nasal cannula oxygen  Post-op Assessment: Report given to RN, Post -op Vital signs reviewed and stable and Patient moving all extremities  Post vital signs: Reviewed and stable  Last Vitals:  Filed Vitals:   01/05/15 0546  BP: 148/68  Pulse: 60  Temp: 36.4 C  Resp: 18    Complications: No apparent anesthesia complications

## 2015-01-05 NOTE — Interval H&P Note (Signed)
History and Physical Interval Note:  01/05/2015 7:34 AM  Brian Manning  has presented today for surgery, with the diagnosis of RIGHT SHOULDER OA  The various methods of treatment have been discussed with the patient and family. After consideration of risks, benefits and other options for treatment, the patient has consented to  Procedure(s): RIGHT REVERSE SHOULDER ARTHROPLASTY (Right) as a surgical intervention .  The patient's history has been reviewed, patient examined, no change in status, stable for surgery.  I have reviewed the patient's chart and labs.  Questions were answered to the patient's satisfaction.     Shanikqua Zarzycki,STEVEN R

## 2015-01-05 NOTE — Anesthesia Procedure Notes (Addendum)
Anesthesia Regional Block:  Interscalene brachial plexus block  Pre-Anesthetic Checklist: ,, timeout performed, Correct Patient, Correct Site, Correct Laterality, Correct Procedure, Correct Position, site marked, Risks and benefits discussed,  Surgical consent,  Pre-op evaluation,  At surgeon's request and post-op pain management  Laterality: Right  Prep: chloraprep       Needles:  Injection technique: Single-shot  Needle Type: Echogenic Stimulator Needle     Needle Length: 5cm 5 cm Needle Gauge: 22 and 22 G    Additional Needles:  Procedures: ultrasound guided (picture in chart) and nerve stimulator Interscalene brachial plexus block  Nerve Stimulator or Paresthesia:  Response: biceps flexion, 0.45 mA,   Additional Responses:   Narrative:  Start time: 01/05/2015 7:12 AM End time: 01/05/2015 7:23 AM Injection made incrementally with aspirations every 5 mL.  Performed by: Personally  Anesthesiologist: HODIERNE, ADAM  Additional Notes: Functioning IV was confirmed and monitors were applied.  A 51mm 22ga Arrow echogenic stimulator needle was used. Sterile prep and drape,hand hygiene and sterile gloves were used.  Negative aspiration and negative test dose prior to incremental administration of local anesthetic. The patient tolerated the procedure well.  Ultrasound guidance: relevent anatomy identified, needle position confirmed, local anesthetic spread visualized around nerve(s), vascular puncture avoided.  Image printed for medical record.    Procedure Name: Intubation Date/Time: 01/05/2015 8:47 AM Performed by: Melina Copa, Marl Seago R Pre-anesthesia Checklist: Patient identified, Emergency Drugs available, Suction available, Patient being monitored and Timeout performed Patient Re-evaluated:Patient Re-evaluated prior to inductionOxygen Delivery Method: Circle system utilized Preoxygenation: Pre-oxygenation with 100% oxygen Intubation Type: IV induction Ventilation: Mask ventilation  without difficulty Laryngoscope Size: Mac and 4 Grade View: Grade II Tube type: Oral Tube size: 8.0 mm Number of attempts: 1 Airway Equipment and Method: Stylet Placement Confirmation: positive ETCO2,  ETT inserted through vocal cords under direct vision and breath sounds checked- equal and bilateral Secured at: 22 cm Tube secured with: Tape Dental Injury: Teeth and Oropharynx as per pre-operative assessment

## 2015-01-06 LAB — BASIC METABOLIC PANEL
Anion gap: 8 (ref 5–15)
BUN: 10 mg/dL (ref 6–20)
CO2: 26 mmol/L (ref 22–32)
CREATININE: 0.82 mg/dL (ref 0.61–1.24)
Calcium: 8.6 mg/dL — ABNORMAL LOW (ref 8.9–10.3)
Chloride: 102 mmol/L (ref 101–111)
GFR calc Af Amer: >60 mL/min (ref >60)
GFR calc non Af Amer: >60 mL/min (ref >60)
Glucose, Bld: 116 mg/dL — ABNORMAL HIGH (ref 70–99)
Potassium: 4 mmol/L (ref 3.5–5.1)
SODIUM: 136 mmol/L (ref 135–145)

## 2015-01-06 LAB — HEMOGLOBIN AND HEMATOCRIT, BLOOD
HEMATOCRIT: 37 % — AB (ref 39.0–52.0)
HEMOGLOBIN: 12.5 g/dL — AB (ref 13.0–17.0)

## 2015-01-06 MED ORDER — OXYCODONE-ACETAMINOPHEN 5-325 MG PO TABS: 1.0000 | ORAL_TABLET | ORAL | Status: DC | PRN

## 2015-01-06 MED ORDER — TIZANIDINE HCL 4 MG PO TABS: 4.0000 mg | ORAL_TABLET | Freq: Three times a day (TID) | ORAL | Status: DC | PRN

## 2015-01-06 NOTE — Discharge Instructions (Signed)
Ice to the shoulder constantly, keep the incision covered and clean and dry for one week, then ok to get it wet in the shower.  Do exercises every hour while awake, lap slides, pendulums, wrist and elbow ROM  Wear sling as needed. May remove for activity of daily living  Non weight bearing right arm. No push pull  DO NOT push out of a chair with the arm.  Follow up in two weeks  [phone]

## 2015-01-06 NOTE — Evaluation (Signed)
Occupational Therapy Evaluation Patient Details Name: Brian Manning MRN: [id number] DOB: 11-29-1927 Today's Date: 01/06/2015    History of Present Illness 81 y.o. s/p Rt reverse TSA.   Clinical Impression   Pt s/p above. Education provided in session to pt and his sons. Feel pt is safe to d/c home with son available to assist. Family verbalized understanding of information covered and handouts were given to them.    Follow Up Recommendations  No OT follow up;Supervision/Assistance - 24 hour    Equipment Recommendations  None recommended by OT    Recommendations for Other Services       Precautions / Restrictions Precautions Precautions: Fall;Shoulder Type of Shoulder Precautions: no pushing, pulling, lifting with Rt arm (can use to hold light items); shoulder FF-90 degrees, abduction-60 degrees and external rotation-30 degrees Shoulder Interventions: Shoulder sling/immobilizer;Off for dressing/bathing/exercises Precaution Booklet Issued: Yes (comment) Precaution Comments: educated on precautions Required Braces or Orthoses: Sling Restrictions Weight Bearing Restrictions: Yes RUE Weight Bearing: Non weight bearing      Mobility Bed Mobility Overal bed mobility: Needs Assistance Bed Mobility: Supine to Sit     Supine to sit: Max assist     General bed mobility comments: explained that rolling may be easier. Showed sons how they can assist pt. assist with hips and trunk.  Transfers Overall transfer level: Needs assistance   Transfers: Sit to/from Stand Sit to Stand: Min guard         General transfer comment: Pt with uncontrolled descent when going to sit on simulated regular height toilet, but practiced a second time and pt did better.     Balance    Min assist for balance when standing in front of bed and family assisting with LB dressing.                                        ADL Overall ADL's : Needs assistance/impaired                  Upper Body Dressing : Maximal assistance;Sitting (feel son could independently assist pt)   Lower Body Dressing: Maximal assistance;Sit to/from stand (son assisted)   Toilet Transfer: Supervision-Min guard;Ambulation (cane; sit <> stand transfer-simulated regular height toilet; decreased descent when going to sit on simulated regular height toilet)           Functional mobility during ADLs: Min guard;Supervision/safety;Cane (ambulated with and without cane) General ADL Comments: Educated on information on shoulder handout. Pt got dressed in session with assist. Sons present for session. Discussed incorporating RUE into ADLs and to be gentle/take it easy. Discussed icing Rt shoulder. Educated on safety such as rugs/items on floor and safe footwear and someone being with pt at home and use of belt around pt.     Vision     Perception     Praxis      Pertinent Vitals/Pain Pain Assessment: 0-10 Pain Score:  (2-3) Pain Location: right shoulder Pain Intervention(s): Repositioned;Monitored during session     Hand Dominance Right   Extremity/Trunk Assessment Upper Extremity Assessment Upper Extremity Assessment: RUE deficits/detail RUE Deficits / Details: s/p reverse TSA; pain with shoulder movement; wrist and hand ROM-WFL; pain in shoulder with elbow ROM.   Lower Extremity Assessment Lower Extremity Assessment: Overall WFL for tasks assessed       Communication Communication Communication: HOH   Cognition Arousal/Alertness: Awake/alert Behavior During Therapy: [facility]  for tasks assessed/performed Overall Cognitive Status: Within Functional Limits for tasks assessed                     General Comments       Exercises Exercises: Shoulder;Other exercises Other Exercises Other Exercises: Pt performed approximately 5 reps each of right shoulder FF, ER, and abduction (AAROM). Recommended to perform in supine and did this in session. Other Exercises: Pt  performed approximately 10 reps each of right elbow flexion/extension (AAROM), wrist composite flexion/extension (AROM), and digit composite flexion/extension (AROM). Explained alternative positions to perform elbow ROM.   Shoulder Instructions Shoulder Instructions Donning/doffing shirt without moving shoulder:  (pt allowed to move shoulder;feel caregiver could manage this; educated on technique and OT assisted pt) Method for sponge bathing under operated UE:  (educated; feel caregiver could manage this) Donning/doffing sling/immobilizer:  (feel caregiver could manage this-educated) Correct positioning of sling/immobilizer:  (educated; feel caregiver could manage this) ROM for elbow, wrist and digits of operated UE: Moderate assistance (feel caregiver could manage this) Sling wearing schedule (on at all times/off for ADL's): pt could not recall all exceptions to sling but caregiver able to help with what pt was missing. Proper positioning of operated UE when showering:  (educated) Positioning of UE while sleeping: Caregiver independent with task-able to verbalize    Home Living Family/patient expects to be discharged to:: Private residence Living Arrangements: Alone Available Help at Discharge: Family;Other (Comment) (son staying with pt) Type of Home: House Home Access: Stairs to enter [employer] of Steps: 1 Entrance Stairs-Rails: None Home Layout: One level     Bathroom Shower/Tub: Teacher, early years/pre: Standard (sink close on left side)     Home Equipment: Cane - single point;Cane - quad;Other (comment) (additional sling)          Prior Functioning/Environment Level of Independence: Independent             OT Diagnosis: Acute pain   OT Problem List:     OT Treatment/Interventions:      OT Goals(Current goals can be found in the care plan section)    OT Frequency:     Barriers to D/C:            Co-evaluation              End  of Session Equipment Utilized During Treatment: Gait belt;Other (comment) (straight cane and sling) Nurse Communication: Other (comment) (asking about pain meds; ready for d/c-no followup)  Activity Tolerance: Patient tolerated treatment well Patient left: in chair;with family/visitor present   Time: 4854-6270 OT Time Calculation (min): 51 min Charges:  OT General Charges $OT Visit: 1 Procedure OT Evaluation $Initial OT Evaluation Tier I: 1 Procedure OT Treatments $Self Care/Home Management : 8-22 mins $Therapeutic Exercise: 8-22 mins G-CodesBenito Mccreedy OTR/L [phone] 01/06/2015, 11:34 AM

## 2015-01-06 NOTE — Progress Notes (Signed)
Orthopedics Progress Note  Subjective: My shoulder is hurting this morning. The block wore off at 5 AM  Objective:  Filed Vitals:   01/06/15 0432  BP: 123/52  Pulse: 65  Temp: 98 F (36.7 C)  Resp: 16    General: Awake and alert  Musculoskeletal: right shoulder dressing CDI, NVI Neurovascularly intact  Lab Results  Component Value Date   WBC 4.9 12/28/2014   HGB 12.5* 01/06/2015   HCT 37.0* 01/06/2015   MCV 95.2 12/28/2014   PLT 199 12/28/2014       Component Value Date/Time   NA 136 01/06/2015 0507   K 4.0 01/06/2015 0507   CL 102 01/06/2015 0507   CO2 26 01/06/2015 0507   GLUCOSE 116* 01/06/2015 0507   BUN 10 01/06/2015 0507   CREATININE 0.82 01/06/2015 0507   CREATININE 0.87 06/13/2014 0902   CALCIUM 8.6* 01/06/2015 0507   GFRNONAA >60 01/06/2015 0507   GFRAA >60 01/06/2015 0507    No results found for: INR, PROTIME  Assessment/Plan: POD #1 s/p Procedure(s): RIGHT REVERSE SHOULDER ARTHROPLASTY stable this morning Occupational therapy this morning before discharge Gel bandages to go home Rx on chart and D/C summary done.  Doran Heater. Veverly Fells, MD 01/06/2015 7:45 AM

## 2015-01-06 NOTE — Op Note (Signed)
NAME:  Brian Manning, Brian Manning NO.:  [id number]  MEDICAL RECORD NO.:  [id number]  LOCATION:  [id number]                        FACILITY:  [facility]  PHYSICIAN:  Doran Heater. Veverly Fells, M.D. DATE OF BIRTH:  09/10/1927  DATE OF PROCEDURE:  01/05/2015 DATE OF DISCHARGE:                              OPERATIVE REPORT   PREOPERATIVE DIAGNOSIS:  Right shoulder arthritis with rotator cuff insufficiency.  POSTOPERATIVE DIAGNOSIS:  Right shoulder arthritis with rotator cuff insufficiency.  PROCEDURE PERFORMED:  Right shoulder reverse total shoulder arthroplasty.  ATTENDING SURGEON:  Doran Heater. Veverly Fells, M.D.  ASSISTANT:  Abbott Pao. Dixon, PA-C, who was scrubbed the entire procedure and necessary for satisfactory completion of surgery.  ANESTHESIA:  General anesthesia was used plus interscalene block.  ESTIMATED BLOOD LOSS:  100 mL.  FLUID REPLACEMENT:  1500 mL crystalloid.  INSTRUMENT COUNTS:  Correct.  COMPLICATIONS:  There were no complications.  ANTIBIOTICS:  Perioperative antibiotics were given.  INDICATIONS:  The patient is an 81 year old male with worsening right shoulder pain and loss of function secondary to rotator cuff insufficiency and severe arthritis.  We discussed with the patient options for management.  Given its extreme weakness and functional limitations, we discussed reverse total shoulder arthroplasty to address both the arthritis and the rotator cuff insufficiency.  The patient elected to proceed.  Risks and benefits were discussed.  Informed consent was obtained.  DESCRIPTION OF PROCEDURE:  After an adequate level of anesthesia achieved, the patient was positioned in the modified beach-chair position.  Right shoulder correctly identified and sterile prep and drape was performed.  Time-out called.  We entered the shoulder using standard deltopectoral incision starting at the coracoid process extending down to the anterior humerus.  Dissection down  through subcutaneous tissues.  We identified the cephalic vein took that laterally with the deltoid, the pectoralis taken medially.  Conjoined tendon identified and retracted medially.  We had then identified the subscapularis, released it subperiosteally off the lesser tuberosity and tagged it with #2 FiberWire for repair at the end.  We then released the inferior capsule, progressively externally rotated and then released the damaged and insufficient supraspinatus tendon and biceps.  We delivered the humerus out of the wound with extension noted there to be intact teres minor, and we partially reduced the infraspinatus to make sure we had enough room for cut.  We then went ahead and entered the proximal humerus with a 6 mm reamer.  We reamed up to size 14 and then resected the humerus with about 10 degrees of retroversion.  Once we had that resected, we prepared our metaphysis, removed excess osteophytes with a rongeur.  We did our metaphyseal reaming with a size 2 centered reamer and then had our humeral preparation plate.  We impacted the trial humeral stem of 14 body with the 2 centered metaphysis in a 10 degrees of retroversion. We impacted that in place.  We then reduced the humerus back into the wound and retracted it posteriorly.  We did a 360-degree release of the capsule and removal of as much capsule as we could, carefully protected the axillary nerve, also removing the biceps anchor. We did tenodesed the biceps with 0 Vicryl  suture and then that was tenodesed on appropriate tension.  Next, with retractors in place and excellent visualization of the glenoid face, we found our center point for metaglene preparation.  We went ahead and drilled the pin.  We then reamed for the metaglene angling a little inferiorly and centered on the inferior portion of the glenoid.  Next, after the reaming was completely drilled out our central PEG hole, we impacted the metaglene into place and  then placed 2 lock screws 1 inferiorly, 1 superiorly.  We had a 42 both superiorly and inferiorly with excellent purchase and then 18 nonlocked anteriorly and posteriorly.  We then placed our 42 standard glenosphere in place and screwed that in position.  Next, we reduced the shoulder 42+ 3 trial poly and had excellent stability and nice tension on the conjoin, negative sulcus, and no gapping with external rotation. We removed the trial components.  I thoroughly irrigated the canal.  We then placed some drill holes and sutures in the lesser tuberosity for repair of the subscap.  Next, we went ahead and used impaction bone grafting technique and then packed the real HA coated final DePuy Delta extend press-fit stem in place.  This was a size 14 stem with a 2 centered body set on the 0 setting, but placed in 10 degrees of retroversion.  We impacted that in position with some available bone graft in the head.  We were pleased with its seating. We then selected real 42+ 3 poly, impacted that in place and then were quite pleased with the balance and stability of the shoulder.  We then went ahead and repaired the infraspinatus back into the greater tuberosity with #2 FiberWire suture.  We then did an anatomic repair of subscapularis.  We are trying to optimize this shoulder for as much function as possible. With this did not tether the shoulder at all, I allowed for full mobility and nice stability. We then irrigated again with pulse irrigator.  We then went ahead and repaired deltopectoral interval with 0 Vicryl suture followed by 2-0 Vicryl with subcutaneous closure and 4-0 Monocryl for skin.  Steri-Strips applied followed by the sterile dressing.  The patient tolerated the surgery well.     Doran Heater. Veverly Fells, M.D.     SRN/MEDQ  D:  01/05/2015  T:  01/06/2015  Job:  [id number]

## 2015-01-06 NOTE — Discharge Summary (Signed)
Physician Discharge Summary   Patient ID: Brian Manning MRN: [id number] DOB/AGE: [birth year] 81 y.o.  Admit date: Jan 24, 2015 Discharge date: 01/06/2015  Admission Diagnoses:  Active Problems:   S/P shoulder replacement   Discharge Diagnoses:  Same   Surgeries: Procedure(s): RIGHT REVERSE SHOULDER ARTHROPLASTY on 24-Jan-2015   Consultants: OT  Discharged Condition: Stable  Hospital Course: [name] is an 81 y.o. male who was admitted 24-Jan-2015 with a chief complaint of right shoulder pain, and found to have a diagnosis of right shoulder arthritis and rotator cuff insufficiency.  They were brought to the operating room on 01-24-15 and underwent the above named procedures.    The patient had an uncomplicated hospital course and was stable for discharge.  Recent vital signs:  Filed Vitals:   01/06/15 0432  BP: 123/52  Pulse: 65  Temp: 98 F (36.7 C)  Resp: 16    Recent laboratory studies:  Results for orders placed or performed during the hospital encounter of 01-24-15  Hemoglobin and hematocrit, blood  Result Value Ref Range   Hemoglobin 12.5 (L) 13.0 - 17.0 g/dL   HCT 37.0 (L) 39.0 - 01.7 %  Basic metabolic panel  Result Value Ref Range   Sodium 136 135 - 145 mmol/L   Potassium 4.0 3.5 - 5.1 mmol/L   Chloride 102 101 - 111 mmol/L   CO2 26 22 - 32 mmol/L   Glucose, Bld 116 (H) 70 - 99 mg/dL   BUN 10 6 - 20 mg/dL   Creatinine, Ser 0.82 0.61 - 1.24 mg/dL   Calcium 8.6 (L) 8.9 - 10.3 mg/dL   GFR calc non Af Amer >60 >60 mL/min   GFR calc Af Amer >60 >60 mL/min   Anion gap 8 5 - 15    Discharge Medications:     Medication List    ASK your doctor about these medications        aspirin EC 81 MG tablet  Take 81 mg by mouth daily.     atenolol 50 MG tablet  Commonly known as:  TENORMIN  Take 50 mg by mouth daily.     doxazosin 2 MG tablet  Commonly known as:  CARDURA  Take 2 mg by mouth daily.     fish oil-omega-3 fatty acids 1000 MG capsule  Take 1 g  by mouth daily.     folic acid 494 MCG tablet  Commonly known as:  FOLVITE  Take 400 mcg by mouth daily.     multivitamin tablet  Take 1 tablet by mouth daily.     naproxen sodium 220 MG tablet  Commonly known as:  ANAPROX  Take 220 mg by mouth 2 (two) times daily as needed (pain).     pravastatin 40 MG tablet  Commonly known as:  PRAVACHOL  Take 40 mg by mouth daily.        Diagnostic Studies: Dg Shoulder Right Port  24-Jan-2015   CLINICAL DATA:  Postop from right shoulder replacement.  EXAM: PORTABLE RIGHT SHOULDER - 2+ VIEW  COMPARISON:  None.  FINDINGS: Right shoulder prosthesis seen in expected position. No evidence of fracture or dislocation. Postoperative soft tissue gas noted.  IMPRESSION: Expected postoperative appearance of right shoulder prosthesis. No evidence of fracture or dislocation.   Electronically Signed   By: Earle Gell M.D.   On: 01/24/15 11:11    Disposition: home        Follow-up Information    Follow up with Augustin Schooling, MD. Call in  2 weeks.   Specialty:  Orthopedic Surgery   Why:  [phone]   Contact information:   [address] [place] [address] [phone]        Signed: Augustin Schooling 01/06/2015, 7:34 AM

## 2015-01-08 ENCOUNTER — Encounter (HOSPITAL_COMMUNITY): Payer: Self-pay | Admitting: Orthopedic Surgery

## 2015-01-08 NOTE — Anesthesia Postprocedure Evaluation (Signed)
Anesthesia Post Note  Patient: Brian Manning  Procedure(s) Performed: Procedure(s) (LRB): RIGHT REVERSE SHOULDER ARTHROPLASTY (Right)  Anesthesia type: General  Patient location: PACU  Post pain: Pain level controlled and Adequate analgesia  Post assessment: Post-op Vital signs reviewed, Patient's Cardiovascular Status Stable, Respiratory Function Stable, Patent Airway and Pain level controlled  Last Vitals:  Filed Vitals:   01/06/15 0432  BP: 123/52  Pulse: 65  Temp: 36.7 C  Resp: 16    Post vital signs: Reviewed and stable  Level of consciousness: awake, alert  and oriented  Complications: No apparent anesthesia complications

## 2015-01-18 DIAGNOSIS — Z471 Aftercare following joint replacement surgery: Secondary | ICD-10-CM | POA: Diagnosis not present

## 2015-01-18 DIAGNOSIS — Z96611 Presence of right artificial shoulder joint: Secondary | ICD-10-CM | POA: Diagnosis not present

## 2015-06-19 ENCOUNTER — Encounter: Payer: Self-pay | Admitting: Family Medicine

## 2015-06-19 ENCOUNTER — Ambulatory Visit (INDEPENDENT_AMBULATORY_CARE_PROVIDER_SITE_OTHER): Payer: Commercial Managed Care - HMO | Admitting: Family Medicine

## 2015-06-19 ENCOUNTER — Telehealth: Payer: Self-pay | Admitting: Family Medicine

## 2015-06-19 VITALS — BP 89/57 | HR 104 | Temp 97.9°F | Wt 186.0 lb

## 2015-06-19 DIAGNOSIS — R531 Weakness: Secondary | ICD-10-CM | POA: Diagnosis not present

## 2015-06-19 DIAGNOSIS — R5383 Other fatigue: Secondary | ICD-10-CM | POA: Diagnosis not present

## 2015-06-19 DIAGNOSIS — R109 Unspecified abdominal pain: Secondary | ICD-10-CM | POA: Insufficient documentation

## 2015-06-19 DIAGNOSIS — I952 Hypotension due to drugs: Secondary | ICD-10-CM | POA: Diagnosis not present

## 2015-06-19 DIAGNOSIS — I959 Hypotension, unspecified: Secondary | ICD-10-CM | POA: Insufficient documentation

## 2015-06-19 DIAGNOSIS — M545 Low back pain: Secondary | ICD-10-CM

## 2015-06-19 DIAGNOSIS — I25810 Atherosclerosis of coronary artery bypass graft(s) without angina pectoris: Secondary | ICD-10-CM

## 2015-06-19 LAB — BASIC METABOLIC PANEL
BUN: 27 mg/dL — AB (ref 7–25)
CALCIUM: 8.4 mg/dL — AB (ref 8.6–10.3)
CO2: 26 mmol/L (ref 20–31)
Chloride: 97 mmol/L — ABNORMAL LOW (ref 98–110)
Creat: 1.11 mg/dL (ref 0.70–1.11)
GLUCOSE: 177 mg/dL — AB (ref 65–99)
Potassium: 4.2 mmol/L (ref 3.5–5.3)
Sodium: 133 mmol/L — ABNORMAL LOW (ref 135–146)

## 2015-06-19 LAB — POCT URINALYSIS DIPSTICK
GLUCOSE UA: NEGATIVE
Ketones, UA: NEGATIVE
Nitrite, UA: NEGATIVE
Protein, UA: 100
SPEC GRAV UA: 1.02
UROBILINOGEN UA: 0.2
pH, UA: 5.5

## 2015-06-19 LAB — GLUCOSE, CAPILLARY: Glucose-Capillary: 187 mg/dL — ABNORMAL HIGH (ref 65–99)

## 2015-06-19 LAB — TSH: TSH: 2.12 u[IU]/mL (ref 0.350–4.500)

## 2015-06-19 LAB — POCT GLYCOSYLATED HEMOGLOBIN (HGB A1C): Hemoglobin A1C: 5.7

## 2015-06-19 NOTE — Telephone Encounter (Signed)
Pt called and is very concerned. He states that he has had about 3 episodes of hypoglycemia this week and he states that he fears he is reaching the end of his life. He just wants to talk to Dr. Gwendlyn Deutscher only, please. Sending to [facility] and RN team [employer], ASA

## 2015-06-19 NOTE — Telephone Encounter (Signed)
I spoke with patient's daughter, she stated patient has not been doing well for days, he is weak and pale, falling off his bed, can't stand by himself. I recommended ED visit,but her daughter wanted her to see me first. Appointment scheduled with me today for 10:45 am. Daughter aware of appointment.

## 2015-06-19 NOTE — Assessment & Plan Note (Signed)
R/O stone or UTI He could not give his urine during this visit. Urine cup given, he will return with his urine today for testing. Improve hydration in the mean time. Return precaution discussed.

## 2015-06-19 NOTE — Assessment & Plan Note (Signed)
Likely due to Atenolol as well as poor intake. Hold Atenolol for now. Improve diet and fluid intake. Return in 2 days for reassessment. Return precaution discussed.

## 2015-06-19 NOTE — Assessment & Plan Note (Addendum)
Likely multifactorial ( low BP, poor appetite, ?? Urine infection based of hx of flank pain). Orthostatic BP measurement was positive today.  Instruction given on fall precaution and about orthostatic hypotension. Hold Atenolol for now based on low BP. Increase and improve on diet and I recommended adding one bottle of ensure daily to meal since he has lost couple of lbs since last visit. Uncertain if up to date with colon cancer screening but he declined during last discussion. I recommended inpatient observation but he declined. He will likely do well with 24 hrs observation at home. CBC, Bmet and TSH checked today as well as A1C. CBG looks good so hypoglycemia unlikely a factor her. F/U in 2 days for reassessment and BP check.  Return precaution discussed. I will consider PT referral if no improvement at next visit in 2 days.

## 2015-06-19 NOTE — Telephone Encounter (Signed)
Will forward to MD. Jazmin Hartsell,CMA  

## 2015-06-19 NOTE — Progress Notes (Signed)
Subjective:     Patient ID: Brian Manning, male   DOB: April 15, 1928, 81 y.o.   MRN: [id number]  HPI Weakness:Brought in by his daughter in-law for hx of weakness for 1 wk associated with frequent fell at home. He recently fell off the bed twice in the last 1 week and  5 days ago which was the last time he fell, his leg just gave up on him. He denies head trauma, no LOC. He fell on the left shoulder, denies any shoulder pain currently. Appetite is poor since 2 wks ago. He drinks a blended drink ( raw egg, dry milk, cocoa powder, vanilla, small carrot, red apple, spinach,some cooked grain) twice a day. He will eat lunch in between. Denies GI symptoms. No change in bowel habit. Hypotension: Patient is currently on Atenolol. He stated recently at a [employer] office he was told his BP was pretty low. Last dose of atenolol was this morning. Back pain:C/O Right flank pain x 1 wk. No fever,urine color at times is a little darker. No blood in his urine.  Current Outpatient Prescriptions on File Prior to Visit  Medication Sig Dispense Refill  . aspirin EC 81 MG tablet Take 81 mg by mouth daily.      Marland Kitchen atenolol (TENORMIN) 50 MG tablet Take 50 mg by mouth daily.    Marland Kitchen doxazosin (CARDURA) 2 MG tablet Take 2 mg by mouth daily.    . fish oil-omega-3 fatty acids 1000 MG capsule Take 1 g by mouth daily.     . folic acid (FOLVITE) 563 MCG tablet Take 400 mcg by mouth daily.      . Multiple Vitamin (MULTIVITAMIN) tablet Take 1 tablet by mouth daily.      . naproxen sodium (ANAPROX) 220 MG tablet Take 220 mg by mouth 2 (two) times daily as needed (pain).     Marland Kitchen oxyCODONE-acetaminophen (ROXICET) 5-325 MG per tablet Take 1-2 tablets by mouth every 4 (four) hours as needed for severe pain. 60 tablet 0  . pravastatin (PRAVACHOL) 40 MG tablet Take 40 mg by mouth daily.    Marland Kitchen tiZANidine (ZANAFLEX) 4 MG tablet Take 1 tablet (4 mg total) by mouth every 8 (eight) hours as needed for muscle spasms. 30 tablet 0   No current  facility-administered medications on file prior to visit.   Past Medical History  Diagnosis Date  . Hyperlipidemia   . Hypertension   . Coronary artery disease      Review of Systems  Constitutional: Positive for activity change, appetite change and fatigue.  Respiratory: Negative.   Cardiovascular: Negative.   Genitourinary: Positive for flank pain. Negative for dysuria.  Neurological: Positive for weakness. Negative for syncope.  Psychiatric/Behavioral: Negative.   All other systems reviewed and are negative.      Filed Vitals:   06/19/15 1034  BP: 89/57  Pulse: 104  Temp: 97.9 F (36.6 C)  TempSrc: Oral  Weight: 186 lb (84.369 kg)    Objective:   Physical Exam  Constitutional: He is oriented to person, place, and time. He appears well-developed. No distress.  Cardiovascular: Normal rate, regular rhythm, normal heart sounds and intact distal pulses.   No murmur heard. Pulmonary/Chest: Effort normal and breath sounds normal. No respiratory distress. He has no wheezes.  Abdominal: Soft. Bowel sounds are normal. He exhibits no distension and no mass. There is no tenderness.  Neurological: He is alert and oriented to person, place, and time. He displays normal reflexes. No cranial nerve deficit.  He exhibits normal muscle tone. Coordination normal.  Mostly seated on the wheel chair  Skin: No rash noted.  Psychiatric: He has a normal mood and affect. His behavior is normal.  Nursing note and vitals reviewed.      Assessment:     Weakness Hypotension Flank pain      Plan:     Check problem list.

## 2015-06-19 NOTE — Patient Instructions (Signed)
It was nice seeing you today, I am sorry about your weakness,it is likely related to your low BP. Please stop your Atenolol for now, monitor BP at home. I had recommended hospital observation over night but per you and grand daughter we can monitor you at home with 24 hours supervision. Please don't stay home alone without supervision for the next 48-72 hours. If symptoms worsens please go to the ED. I will like to see you back this Friday.   Fall Prevention in the Home  Falls can cause injuries. They can happen to people of all ages. There are many things you can do to make your home safe and to help prevent falls.  WHAT CAN I DO ON THE OUTSIDE OF MY HOME?  Regularly fix the edges of walkways and driveways and fix any cracks.  Remove anything that might make you trip as you walk through a door, such as a raised step or threshold.  Trim any bushes or trees on the path to your home.  Use bright outdoor lighting.  Clear any walking paths of anything that might make someone trip, such as rocks or tools.  Regularly check to see if handrails are loose or broken. Make sure that both sides of any steps have handrails.  Any raised decks and porches should have guardrails on the edges.  Have any leaves, snow, or ice cleared regularly.  Use sand or salt on walking paths during winter.  Clean up any spills in your garage right away. This includes oil or grease spills. WHAT CAN I DO IN THE BATHROOM?   Use night lights.  Install grab bars by the toilet and in the tub and shower. Do not use towel bars as grab bars.  Use non-skid mats or decals in the tub or shower.  If you need to sit down in the shower, use a plastic, non-slip stool.  Keep the floor dry. Clean up any water that spills on the floor as soon as it happens.  Remove soap buildup in the tub or shower regularly.  Attach bath mats securely with double-sided non-slip rug tape.  Do not have throw rugs and other things on the floor  that can make you trip. WHAT CAN I DO IN THE BEDROOM?  Use night lights.  Make sure that you have a light by your bed that is easy to reach.  Do not use any sheets or blankets that are too big for your bed. They should not hang down onto the floor.  Have a firm chair that has side arms. You can use this for support while you get dressed.  Do not have throw rugs and other things on the floor that can make you trip. WHAT CAN I DO IN THE KITCHEN?  Clean up any spills right away.  Avoid walking on wet floors.  Keep items that you use a lot in easy-to-reach places.  If you need to reach something above you, use a strong step stool that has a grab bar.  Keep electrical cords out of the way.  Do not use floor polish or wax that makes floors slippery. If you must use wax, use non-skid floor wax.  Do not have throw rugs and other things on the floor that can make you trip. WHAT CAN I DO WITH MY STAIRS?  Do not leave any items on the stairs.  Make sure that there are handrails on both sides of the stairs and use them. Fix handrails that are broken  or loose. Make sure that handrails are as long as the stairways.  Check any carpeting to make sure that it is firmly attached to the stairs. Fix any carpet that is loose or worn.  Avoid having throw rugs at the top or bottom of the stairs. If you do have throw rugs, attach them to the floor with carpet tape.  Make sure that you have a light switch at the top of the stairs and the bottom of the stairs. If you do not have them, ask someone to add them for you. WHAT ELSE CAN I DO TO HELP PREVENT FALLS?  Wear shoes that:  Do not have high heels.  Have rubber bottoms.  Are comfortable and fit you well.  Are closed at the toe. Do not wear sandals.  If you use a stepladder:  Make sure that it is fully opened. Do not climb a closed stepladder.  Make sure that both sides of the stepladder are locked into place.  Ask someone to hold it  for you, if possible.  Clearly mark and make sure that you can see:  Any grab bars or handrails.  First and last steps.  Where the edge of each step is.  Use tools that help you move around (mobility aids) if they are needed. These include:  Canes.  Walkers.  Scooters.  Crutches.  Turn on the lights when you go into a dark area. Replace any light bulbs as soon as they burn out.  Set up your furniture so you have a clear path. Avoid moving your furniture around.  If any of your floors are uneven, fix them.  If there are any pets around you, be aware of where they are.  Review your medicines with your doctor. Some medicines can make you feel dizzy. This can increase your chance of falling. Ask your doctor what other things that you can do to help prevent falls.   This information is not intended to replace advice given to you by your health care provider. Make sure you discuss any questions you have with your health care provider.   Document Released: 06/14/2009 Document Revised: 01/02/2015 Document Reviewed: 09/22/2014 Elsevier Interactive Patient Education [employer].

## 2015-06-20 ENCOUNTER — Telehealth: Payer: Self-pay | Admitting: Family Medicine

## 2015-06-20 LAB — CBC WITH DIFFERENTIAL/PLATELET
Basophils Absolute: 0 10*3/uL (ref 0.0–0.1)
Basophils Relative: 0 % (ref 0–1)
EOS PCT: 0 % (ref 0–5)
Eosinophils Absolute: 0 10*3/uL (ref 0.0–0.7)
HCT: 39.3 % (ref 39.0–52.0)
HEMOGLOBIN: 13.3 g/dL (ref 13.0–17.0)
Lymphocytes Relative: 12 % (ref 12–46)
Lymphs Abs: 0.9 10*3/uL (ref 0.7–4.0)
MCH: 31.6 pg (ref 26.0–34.0)
MCHC: 33.8 g/dL (ref 30.0–36.0)
MCV: 93.3 fL (ref 78.0–100.0)
MONO ABS: 0.5 10*3/uL (ref 0.1–1.0)
MPV: 9.5 fL (ref 8.6–12.4)
Monocytes Relative: 7 % (ref 3–12)
NEUTROS ABS: 6.3 10*3/uL (ref 1.7–7.7)
Neutrophils Relative %: 81 % — ABNORMAL HIGH (ref 43–77)
Platelets: 218 10*3/uL (ref 150–400)
RBC: 4.21 MIL/uL — ABNORMAL LOW (ref 4.22–5.81)
RDW: 13.5 % (ref 11.5–15.5)
WBC: 7.8 10*3/uL (ref 4.0–10.5)

## 2015-06-20 NOTE — Assessment & Plan Note (Signed)
Currently stable. Likely reason for beta blocker. Will hold Atenolol for now due to hypotension. Continue ASA 81 mg qd.

## 2015-06-20 NOTE — Telephone Encounter (Signed)
I called Brian Manning back and discussed his urine and blood test result from yesterday. All questions were answered. He stated he feels better since yesterday and had not restarted his Atenolol as instructed. He is reminded of his appointment with me tomorrow for BP check.

## 2015-06-20 NOTE — Addendum Note (Signed)
Addended by: Andrena Mews T on: 06/20/2015 07:42 AM   Modules accepted: Orders, Medications

## 2015-06-20 NOTE — Telephone Encounter (Signed)
Call back to discuss test result.

## 2015-06-20 NOTE — Telephone Encounter (Signed)
Will forward to MD. Brandin Dilday,CMA  

## 2015-06-20 NOTE — Telephone Encounter (Signed)
Son-in-law is returning Dr. Macario Golds call. He will be there at 1:30 with his father-in-law to help him interpret the call. jw

## 2015-06-21 ENCOUNTER — Ambulatory Visit (INDEPENDENT_AMBULATORY_CARE_PROVIDER_SITE_OTHER): Payer: Commercial Managed Care - HMO | Admitting: Family Medicine

## 2015-06-21 ENCOUNTER — Ambulatory Visit (HOSPITAL_COMMUNITY)
Admission: RE | Admit: 2015-06-21 | Discharge: 2015-06-21 | Disposition: A | Discharge status: Home / Self Care | Payer: Commercial Managed Care - HMO | Source: Ambulatory Visit | Attending: Family Medicine | Admitting: Family Medicine

## 2015-06-21 ENCOUNTER — Telehealth: Payer: Self-pay | Admitting: Family Medicine

## 2015-06-21 ENCOUNTER — Other Ambulatory Visit: Payer: Self-pay

## 2015-06-21 VITALS — BP 92/54 | HR 104 | Wt 184.6 lb

## 2015-06-21 DIAGNOSIS — I952 Hypotension due to drugs: Secondary | ICD-10-CM

## 2015-06-21 DIAGNOSIS — R000 Tachycardia, unspecified: Secondary | ICD-10-CM

## 2015-06-21 DIAGNOSIS — I25810 Atherosclerosis of coronary artery bypass graft(s) without angina pectoris: Secondary | ICD-10-CM

## 2015-06-21 DIAGNOSIS — R531 Weakness: Secondary | ICD-10-CM | POA: Diagnosis not present

## 2015-06-21 DIAGNOSIS — R9431 Abnormal electrocardiogram [ECG] [EKG]: Secondary | ICD-10-CM | POA: Insufficient documentation

## 2015-06-21 DIAGNOSIS — I4891 Unspecified atrial fibrillation: Secondary | ICD-10-CM | POA: Diagnosis not present

## 2015-06-21 MED ORDER — APIXABAN 2.5 MG PO TABS: 2.5000 mg | ORAL_TABLET | Freq: Two times a day (BID) | ORAL | Status: DC

## 2015-06-21 NOTE — Telephone Encounter (Signed)
Daughter called because of the message that was left this morning on her fathers voice mail. Please call back. jw

## 2015-06-21 NOTE — Progress Notes (Signed)
Subjective:     Patient ID: Brian Manning, male   DOB: 05-05-1928, 81 y.o.   MRN: [id number]  HPI Weakness: Here for follow up, he feels much better since he d/c his Atenolol. He has not had any new falls since last visit. He has been ambulating well with a cane not needing a wheel chair.  Hypotension: He d/c his Atenolol, he has not been checking his BP at home. He continues to take Cardura for his BPH. Denies any dizziness. CAD: Denies chest pain, no palpitations, no SOB. He is on ASA 81 mg qd. He stated he had heart surgery about 30 years ago and since then he has not had cardiac problems.  Current Outpatient Prescriptions on File Prior to Visit  Medication Sig Dispense Refill  . aspirin EC 81 MG tablet Take 81 mg by mouth daily.      Marland Kitchen doxazosin (CARDURA) 2 MG tablet Take 2 mg by mouth daily.    . fish oil-omega-3 fatty acids 1000 MG capsule Take 1 g by mouth daily.     . folic acid (FOLVITE) 401 MCG tablet Take 400 mcg by mouth daily.      . Multiple Vitamin (MULTIVITAMIN) tablet Take 1 tablet by mouth daily.      . pravastatin (PRAVACHOL) 40 MG tablet Take 40 mg by mouth daily.     No current facility-administered medications on file prior to visit.   Past Medical History  Diagnosis Date  . Hyperlipidemia   . Hypertension   . Coronary artery disease      Review of Systems  Constitutional: Positive for fatigue.       Fatigue improved a lot since he stopped his atenolol  Respiratory: Negative.   Cardiovascular: Negative.  Negative for chest pain, palpitations and leg swelling.  Gastrointestinal: Negative.   Genitourinary: Negative.   Neurological: Negative for dizziness, tremors, syncope and headaches.  All other systems reviewed and are negative.  Filed Vitals:   06/21/15 0836 06/21/15 0839 06/21/15 0842  BP: 115/98 92/56 92/54   Pulse: 132 122 104  Weight: 184 lb 9.6 oz (83.734 kg)    SpO2:   98%       Objective:   Physical Exam  Constitutional: He is oriented to  person, place, and time. He appears well-developed. No distress.  Cardiovascular: Normal rate, S1 normal, S2 normal and normal heart sounds.  An irregularly irregular rhythm present.  No murmur heard. Pulmonary/Chest: Effort normal and breath sounds normal. No respiratory distress. He has no wheezes.  Abdominal: Soft. Bowel sounds are normal. He exhibits no distension. There is no tenderness.  Musculoskeletal: Normal range of motion. He exhibits no edema.  Neurological: He is alert and oriented to person, place, and time.  Nursing note and vitals reviewed.      Assessment:     Weakness: Hypotension: CAD: New-onset Afib    Plan:     Check problem list

## 2015-06-21 NOTE — Telephone Encounter (Signed)
Will forward to MD. Brian Manning,CMA  

## 2015-06-21 NOTE — Telephone Encounter (Signed)
Patient is aware of echo on 06/27/2015.  Son was present and took down information for the appt. Prim Morace,CMA

## 2015-06-21 NOTE — Assessment & Plan Note (Signed)
BP improved just a little. Patient advised to d/c Cardura since he stated he urinates fine and does not feel the Cardura makes any difference whether he takes it or not. Since Cardura causes orthostatic hypotension I will have him stop it for now and see how he does with urination. Home BP monitoring recommended.

## 2015-06-21 NOTE — Patient Instructions (Addendum)
It was nice seeing you today. I am glad you feel better with your weakness. Your BP picked up a bit but your heart rate flunctuates. EKG shows that you have atrial fibrillation which means irregular hear rate. I will like you to start blood thinner which I sent to your pharmacy. Please call Dr Julious Payer office to schedule cardiology appointment as soon as possible at [phone].  I will see you back in 2 wks. Stop doxazosin (CARDURA) 2 MG tablet  as well which can also drop your BP lower.

## 2015-06-21 NOTE — Assessment & Plan Note (Addendum)
EKG done in the office and read by me shows Afib at a rate of 97 bpm. Patient is clinically stable and no indication for hospital admission. CHA2VASc score of 3. He will benefit from anticoagulant. I called and discussed patient with Dr Acie Fredrickson, the on call cardiologist for [facility]. He agreed with me that no need for hospitalization and recommended anticoagulant and to be seen in the office within 1 wk. Based on age >66 and risk for fall I started him on low dose Eliquis 2.5 mg BID. May continue baby ASA till he sees cardiologist as well. Cardiologist referral ordered. ECHO ordered today. I gave him and his Daughter Dr Elmarie Shiley office number ([phone]) to call for an appointment as soon as possible. They agreed with plan. Return precaution discussed.

## 2015-06-21 NOTE — Telephone Encounter (Signed)
I spoke with patient, he stated he already scheduled appointment with cardiologist. I also informed him that someone will call him from our office to schedule his ECHO. I discussed the [facility] with him, unfortunately they do not offer 24 hrs services. I gave an option of assisted living, he stated he will discuss this with his family. I will see him back in 2 wks.

## 2015-06-21 NOTE — Addendum Note (Signed)
Addended by: Andrena Mews T on: 06/21/2015 02:25 PM   Modules accepted: Orders

## 2015-06-21 NOTE — Assessment & Plan Note (Signed)
On ASA 81 mg qd. No symptoms or signs suggestive of ACS. Monitor for now.

## 2015-06-21 NOTE — Assessment & Plan Note (Signed)
Feeling better since he stopped taking his Atenolol. Fall precaution discussed. Continue 24 hrs supervision for now. Daughter in-law stated she and her husband will not be able to continue 24 hrs supervision for long. I will help set up a PCS to help him at home. F/U in 2 wks.

## 2015-06-27 ENCOUNTER — Other Ambulatory Visit: Payer: Self-pay

## 2015-06-27 ENCOUNTER — Ambulatory Visit (HOSPITAL_COMMUNITY): Payer: Commercial Managed Care - HMO | Attending: Cardiology

## 2015-06-27 DIAGNOSIS — E785 Hyperlipidemia, unspecified: Secondary | ICD-10-CM | POA: Diagnosis not present

## 2015-06-27 DIAGNOSIS — I071 Rheumatic tricuspid insufficiency: Secondary | ICD-10-CM | POA: Insufficient documentation

## 2015-06-27 DIAGNOSIS — I517 Cardiomegaly: Secondary | ICD-10-CM | POA: Diagnosis not present

## 2015-06-27 DIAGNOSIS — I4891 Unspecified atrial fibrillation: Secondary | ICD-10-CM | POA: Diagnosis not present

## 2015-06-27 DIAGNOSIS — I351 Nonrheumatic aortic (valve) insufficiency: Secondary | ICD-10-CM | POA: Insufficient documentation

## 2015-06-27 DIAGNOSIS — I10 Essential (primary) hypertension: Secondary | ICD-10-CM | POA: Diagnosis not present

## 2015-06-27 DIAGNOSIS — K5900 Constipation, unspecified: Secondary | ICD-10-CM

## 2015-06-27 DIAGNOSIS — I340 Nonrheumatic mitral (valve) insufficiency: Secondary | ICD-10-CM | POA: Diagnosis not present

## 2015-06-27 DIAGNOSIS — Z1211 Encounter for screening for malignant neoplasm of colon: Secondary | ICD-10-CM

## 2015-06-28 ENCOUNTER — Ambulatory Visit (INDEPENDENT_AMBULATORY_CARE_PROVIDER_SITE_OTHER): Payer: Commercial Managed Care - HMO | Admitting: Cardiology

## 2015-06-28 ENCOUNTER — Encounter: Payer: Self-pay | Admitting: Cardiology

## 2015-06-28 VITALS — BP 122/68 | HR 72 | Ht 73.0 in | Wt 182.4 lb

## 2015-06-28 DIAGNOSIS — Z7901 Long term (current) use of anticoagulants: Secondary | ICD-10-CM | POA: Diagnosis not present

## 2015-06-28 DIAGNOSIS — I2510 Atherosclerotic heart disease of native coronary artery without angina pectoris: Secondary | ICD-10-CM | POA: Diagnosis not present

## 2015-06-28 DIAGNOSIS — I480 Paroxysmal atrial fibrillation: Secondary | ICD-10-CM

## 2015-06-28 DIAGNOSIS — I2583 Coronary atherosclerosis due to lipid rich plaque: Secondary | ICD-10-CM

## 2015-06-28 DIAGNOSIS — R531 Weakness: Secondary | ICD-10-CM

## 2015-06-28 MED ORDER — APIXABAN 5 MG PO TABS: 5.0000 mg | ORAL_TABLET | Freq: Two times a day (BID) | ORAL | Status: DC

## 2015-06-28 NOTE — Patient Instructions (Signed)
Medication Instructions:  Please increase your Eliquis to 5 mg twice a day. Continue all other medications as listed.  Follow-Up: Follow up in 3 months with Dr Marlou Porch.  If you need a refill on your cardiac medications before your next appointment, please call your pharmacy.  Thank you for choosing [facility]!!

## 2015-06-28 NOTE — Progress Notes (Signed)
Cardiology Office Note   Date:  06/28/2015   ID:  Brian Manning, DOB October 14, 1927, MRN [id number]  PCP:  Andrena Mews, MD  Cardiologist:   Candee Furbish, MD       History of Present Illness: Brian Manning is a 81 y.o. male who presents for evaluation of atrial fibrillation. Currently on Eliquis, also on aspirin 81 mg.  34 years CABG [facility]. No cardiac catheterization post. Prior to CABG felt pressure, truck on chest. November, cold, walked out of dept. Store, felt poor. His cousin was doing a fellowship at [facility] and got admitted to the cardiologist. He went 5 seconds on the treadmill and failed the test. Angiogram revealed a 95% lesion. He underwent bypass surgery. He has been doing very well since then. He has been on atenolol since then until recently discontinued because of significant hypotension.  Retired [employer] recently, weak. Daughter noted this, after coming back from [place].   BP was 80/60. Atenolol stopped. Doxazosin also stopped.   At second visit, return visit he was found to have an irregular heartbeat. EKG confirmed atrial fibrillation, personally viewed.    Past Medical History  Diagnosis Date  . Hyperlipidemia   . Hypertension   . Coronary artery disease     Past Surgical History  Procedure Laterality Date  . Hernia repair  2012  . US echocardiography  2012    pre-op for hernia: wnl  per patient  . Bladder polyp    . Left shoulder replacement Left 2014    Done at [facility]  . Coronary artery bypass graft      x1   1982  . Bunionectomy Right     Right foot  . Reverse shoulder arthroplasty Right 01/05/2015    Procedure: RIGHT REVERSE SHOULDER ARTHROPLASTY;  Surgeon: Netta Cedars, MD;  Location: [facility];  Service: Orthopedics;  Laterality: Right;     Current Outpatient Prescriptions  Medication Sig Dispense Refill  . apixaban (ELIQUIS) 2.5 MG TABS tablet Take 1 tablet (2.5 mg total) by mouth 2 (two) times daily. 60 tablet 0  . aspirin EC 81  MG tablet Take 81 mg by mouth daily.      Marland Kitchen EAR WAX REMOVAL DROPS 6.5 % otic solution     . fish oil-omega-3 fatty acids 1000 MG capsule Take 1 g by mouth daily.     . folic acid (FOLVITE) 970 MCG tablet Take 400 mcg by mouth daily.      . Multiple Vitamin (MULTIVITAMIN) tablet Take 1 tablet by mouth daily.      . pravastatin (PRAVACHOL) 40 MG tablet Take 40 mg by mouth daily.     No current facility-administered medications for this visit.    Allergies:   Review of patient's allergies indicates no known allergies.    Social History:  The patient  reports that he has never smoked. He has never used smokeless tobacco. He reports that he drinks about 4.2 oz of alcohol per week. He reports that he does not use illicit drugs.   Family History:  The patient's family history includes Cancer (age of onset: 85) in his father; Diabetes in his mother; Stroke in his mother.    ROS:  Please see the history of present illness.   Otherwise, review of systems are positive for none.   All other systems are reviewed and negative.    PHYSICAL EXAM: VS:  BP 122/68 mmHg  Pulse 72  Ht 6\' 1"  (1.854 m)  Wt 182 lb 6.4 oz (82.736 kg)  BMI 24.07 kg/m2 , BMI Body mass index is 24.07 kg/(m^2). GEN: Well nourished, well developed, in no acute distressElderly but fit HEENT: normal Neck: no JVD, carotid bruits, or masses Cardiac: RRR; no murmurs, rubs, or gallops,no edema , rare ectopy Respiratory:  clear to auscultation bilaterally, normal work of breathing GI: soft, nontender, nondistended, + BS MS: no deformity or atrophy Skin: warm and dry, no rash Neuro:  Strength and sensation are intact Psych: euthymic mood, full affect   EKG:  EKG today 06/28/15 - sinus rhythm, 71, nonspecific ST-T wave changes, no other abnormality. Prior EKG is a fibrillation with heart rate less than 100 bpm.  Recent Labs: 06/19/2015: BUN 27*; Creat 1.11; Hemoglobin 13.3; Platelets 218; Potassium 4.2; Sodium 133*; TSH 2.120     Lipid Panel    Component Value Date/Time   CHOL 106 06/13/2014 0902   TRIG 51 06/13/2014 0902   HDL 44 06/13/2014 0902   CHOLHDL 2.4 06/13/2014 0902   VLDL 10 06/13/2014 0902   LDLCALC 52 06/13/2014 0902      Wt Readings from Last 3 Encounters:  06/28/15 182 lb 6.4 oz (82.736 kg)  06/21/15 184 lb 9.6 oz (83.734 kg)  06/19/15 186 lb (84.369 kg)    ECHO: 06/27/15 - Left ventricle: The cavity size was normal. There was mild focal basal hypertrophy of the septum. Systolic function was normal. The estimated ejection fraction was in the range of 55% to 60%. Wall motion was normal; there were no regional wall motion abnormalities. - Aortic valve: Trileaflet; mildly thickened, mildly calcified leaflets. There was trivial regurgitation. - Mitral valve: There was trivial regurgitation. - Right ventricle: Poorly visualized. - Tricuspid valve: There was mild regurgitation. - Pulmonary arteries: PA peak pressure: 34 mm Hg (S).  Other studies Reviewed: Additional studies/ records that were reviewed today include: EKGs, lab work, office notes reviewed. Review of the above records demonstrates: As above   ASSESSMENT AND PLAN:  Atrial fibrillation, paroxysmal  - Newly diagnosed in the setting of possible viral illness with hypotension.  - Likely precipitated by recent weakness, possible viral illness.  - CHADS-Vasc 3  - Agree with anticoagulation however I will increase his dose Eliquis to 5 g twice a day. His age is greater than 10 but his creatinine is less than 1.5 and his weight is greater than 60 kg.  - I think that since we have seen atrial fibrillation now and confirmed it, given his age, there is a high likelihood that he may develop atrial fibrillation once again. I would like to continue with anticoagulation.  - Discussed risks of bleeding. Understands. We will stop aspirin 81 mg.  - Continue to monitor CBC and basic metabolic profile every 6 months.  - Echo  reassuring.  Coronary artery disease status post bypass surgery  - Several years ago [facility]. He's been doing very well, no subsequent cardiac catheterizations.  - Beta blocker has been stopped because of hypotension. Benefit of beta blocker is mild in the situation of coronary disease having been so far out of bypass surgery.  Hypotension  - Resolved after stopping doxazosin and atenolol.  - I'm comfortable with him being off of these medications at this point.  Fatigue/weakness  - Resolved. He is feeling much better. He is back to himself.   Current medicines are reviewed at length with the patient today.  The patient does not have concerns regarding medicines.  The following changes have been made:  Eloquence has been adjusted to 5 g twice a day. Aspirin has been stopped. Agree with stopping atenolol and doxazosin.  Labs/ tests ordered today include:  No orders of the defined types were placed in this encounter.     Disposition:   FU with [facility] in 3 months  Signed, Candee Furbish, MD  06/28/2015 10:29 AM    [facility] [place], [place], [place]  [address] Phone: [phone]; Fax: [phone]

## 2015-07-06 ENCOUNTER — Encounter: Payer: Self-pay | Admitting: Family Medicine

## 2015-07-06 ENCOUNTER — Ambulatory Visit (INDEPENDENT_AMBULATORY_CARE_PROVIDER_SITE_OTHER): Payer: Commercial Managed Care - HMO | Admitting: Family Medicine

## 2015-07-06 VITALS — BP 137/64 | HR 85 | Wt 181.0 lb

## 2015-07-06 DIAGNOSIS — I480 Paroxysmal atrial fibrillation: Secondary | ICD-10-CM | POA: Diagnosis not present

## 2015-07-06 DIAGNOSIS — N390 Urinary tract infection, site not specified: Secondary | ICD-10-CM | POA: Insufficient documentation

## 2015-07-06 DIAGNOSIS — R634 Abnormal weight loss: Secondary | ICD-10-CM | POA: Insufficient documentation

## 2015-07-06 DIAGNOSIS — R319 Hematuria, unspecified: Secondary | ICD-10-CM

## 2015-07-06 DIAGNOSIS — R300 Dysuria: Secondary | ICD-10-CM | POA: Diagnosis not present

## 2015-07-06 DIAGNOSIS — K5900 Constipation, unspecified: Secondary | ICD-10-CM | POA: Diagnosis not present

## 2015-07-06 DIAGNOSIS — I10 Essential (primary) hypertension: Secondary | ICD-10-CM

## 2015-07-06 DIAGNOSIS — N400 Enlarged prostate without lower urinary tract symptoms: Secondary | ICD-10-CM

## 2015-07-06 LAB — POCT URINALYSIS DIPSTICK
BILIRUBIN UA: NEGATIVE
GLUCOSE UA: NEGATIVE
Ketones, UA: NEGATIVE
Nitrite, UA: POSITIVE
Spec Grav, UA: 1.01
Urobilinogen, UA: 0.2
pH, UA: 6.5

## 2015-07-06 LAB — POCT UA - MICROSCOPIC ONLY

## 2015-07-06 LAB — HEMOCCULT GUIAC POC 1CARD (OFFICE): FECAL OCCULT BLD: NEGATIVE

## 2015-07-06 MED ORDER — CEPHALEXIN 500 MG PO CAPS: 500.0000 mg | ORAL_CAPSULE | Freq: Two times a day (BID) | ORAL | Status: DC

## 2015-07-06 MED ORDER — LACTULOSE 10 GM/15ML PO SOLN: 10.0000 g | Freq: Every day | ORAL | Status: DC | PRN

## 2015-07-06 NOTE — Assessment & Plan Note (Signed)
Worrisome in patient with inadequate cancer screening.  I discussed one time colonoscopy but since he will not proceed with treatment if diagnosed with cancer he is currently not interested. Weight loss might as well be due to poor diet. Continue ensure and age appropriate diet. Screening for colon CA by stool card. Single FOBT done today was negative. I will reassess at next visit.

## 2015-07-06 NOTE — Assessment & Plan Note (Addendum)
With some impaction. Disimpaction digitally was unsuccessful. He is already on different stool softner and laxatives at home including dulcolax, miralax, senna. Lactulose prescribed prn. He is advised to stop med if having too much loose stool. He verbalized understanding. FOBT done today was negative. Stool card given to take home for colon CA screening. He will discuss colonoscopy depending on result.

## 2015-07-06 NOTE — Progress Notes (Signed)
Subjective:     Patient ID: Brian Manning, male   DOB: 1928/03/25, 81 y.o.   MRN: [id number]  HPI Afib/HTN: Patient is here for follow up. Doing well in general. He is gradually regaining his strength. He denies any SOB, no chest pain. He stopped all antihypertensive agents. He is compliant with is Eliquis. Weight loss: Patient stated he is now taking ensure but still not gaining weight. He has improved on his diet as well. Dysuria/BPH: Pressure and burning with urinating.This has been on going for 10 days as well. He is currently off his doxazosin and wondering if this is contributing to his problem and will like a digital rectal exam today. Constipation: He has not moved his bowel for 10 days. Whenever he will move his bowel it will be very small. 10 days ago he took milk of magnesium which helped a little. No blood in his stool.  He took dulcolax this morning. He denies belly pain but he has been gassing a lot. He stated he has never had colonoscopy. He saw a clinician 5 yrs ago who told him he does not need to get it done any longer.He stated if he has colon cancer now he would prefer not to get treated hence will not want to bother with colonoscopy.  Current Outpatient Prescriptions on File Prior to Visit  Medication Sig Dispense Refill  . apixaban (ELIQUIS) 5 MG TABS tablet Take 1 tablet (5 mg total) by mouth 2 (two) times daily. 60 tablet 11  . fish oil-omega-3 fatty acids 1000 MG capsule Take 1 g by mouth daily.     . folic acid (FOLVITE) 885 MCG tablet Take 400 mcg by mouth daily.      . Multiple Vitamin (MULTIVITAMIN) tablet Take 1 tablet by mouth daily.      . pravastatin (PRAVACHOL) 40 MG tablet Take 40 mg by mouth daily.    Marland Kitchen EAR WAX REMOVAL DROPS 6.5 % otic solution      No current facility-administered medications on file prior to visit.   Past Medical History  Diagnosis Date  . Hyperlipidemia   . Hypertension   . Coronary artery disease      Review of Systems    Respiratory: Negative.   Cardiovascular: Negative.   Gastrointestinal: Positive for constipation. Negative for nausea, vomiting, abdominal pain, diarrhea and blood in stool.  Genitourinary: Positive for dysuria. Negative for difficulty urinating.  Musculoskeletal: Negative.   All other systems reviewed and are negative.  Filed Vitals:   07/06/15 1115  BP: 137/64  Pulse: 85  Weight: 181 lb (82.101 kg)  SpO2: 96%       Objective:   Physical Exam  Constitutional: He is oriented to person, place, and time. He appears well-developed. No distress.  Cardiovascular: Normal rate, regular rhythm, normal heart sounds and intact distal pulses.   No murmur heard. Pulmonary/Chest: Effort normal and breath sounds normal. No respiratory distress. He has no wheezes.  Abdominal: Soft. Bowel sounds are normal. He exhibits no distension and no mass. There is no tenderness. There is no rebound and no guarding.  Genitourinary: Rectal exam shows no external hemorrhoid, no internal hemorrhoid, no fissure, no mass and no tenderness. Guaiac negative stool. Prostate is enlarged.     Chaperoned by Phineas Real  Musculoskeletal: Normal range of motion. He exhibits no edema.  Neurological: He is alert and oriented to person, place, and time.  Nursing note and vitals reviewed.      Assessment:  Afib: HTN Weight loss: Dysuria: Constipation:    Plan:     Check problem list.

## 2015-07-06 NOTE — Assessment & Plan Note (Signed)
Currently off meds due to hypotension. BP now back to normal. Plan to continue holding antihypertensive agents for now.

## 2015-07-06 NOTE — Patient Instructions (Signed)
  It was nice seeing you today. I am glad you feel a lot much better with your weakness. Your urine test today shows infection, hence I will give you some antibiotic. I will also like for you to try lactulose for constipation. Please take the stool card given home to get some stool samples to screen for colon cancer. I will like to see you back in 4 wks.   Constipation, Adult Constipation is when a person:  Poops (has a bowel movement) less than 3 times a week.  Has a hard time pooping.  Has poop that is dry, hard, or bigger than normal. HOME CARE   Eat foods with a lot of fiber in them. This includes fruits, vegetables, beans, and whole grains such as brown rice.  Avoid fatty foods and foods with a lot of sugar. This includes french fries, hamburgers, cookies, candy, and soda.  If you are not getting enough fiber from food, take products with added fiber in them (supplements).  Drink enough fluid to keep your pee (urine) clear or pale yellow.  Exercise on a regular basis, or as told by your doctor.  Go to the restroom when you feel like you need to poop. Do not hold it.  Only take medicine as told by your doctor. Do not take medicines that help you poop (laxatives) without talking to your doctor first. GET HELP RIGHT AWAY IF:   You have bright red blood in your poop (stool).  Your constipation lasts more than 4 days or gets worse.  You have belly (abdominal) or butt (rectal) pain.  You have thin poop (as thin as a pencil).  You lose weight, and it cannot be explained. MAKE SURE YOU:   Understand these instructions.  Will watch your condition.  Will get help right away if you are not doing well or get worse.   This information is not intended to replace advice given to you by your health care provider. Make sure you discuss any questions you have with your health care provider.   Document Released: 02/04/2008 Document Revised: 09/08/2014 Document Reviewed:  05/30/2013 Elsevier Interactive Patient Education [employer].

## 2015-07-06 NOTE — Assessment & Plan Note (Signed)
He was on doxazosin which we held due to hypotension. Digital rectal exam today showed mild to moderate prostate enlargement ( smooth surface). He is urinating without difficulty. I will continue to hold this medication for now.

## 2015-07-06 NOTE — Assessment & Plan Note (Signed)
I reviewed his ECHO which looks good. I also reviewed his cardiologist's note. For now he will continue Eliquis 5 mg BID and off ASA due to bleeding risk. We will continue to hold her betablocker due to hx of hypotension.  Monitor on current regimen for now. Cardiology follow up as instructed,

## 2015-07-06 NOTE — Assessment & Plan Note (Signed)
He presented with dysuria today. Urinalysis    Component Value Date/Time   BILIRUBINUR NEG 07/06/2015 1121   PROTEINUR TRACE 07/06/2015 1121   UROBILINOGEN 0.2 07/06/2015 1121   NITRITE POS 07/06/2015 1121   LEUKOCYTESUR moderate (2+)* 07/06/2015 1121     Urine sent to lab for culture. In the mean time I started him on Keflex.

## 2015-07-08 LAB — URINE CULTURE: Colony Count: >=100000

## 2015-07-10 ENCOUNTER — Telehealth: Payer: Self-pay | Admitting: Family Medicine

## 2015-07-10 NOTE — Telephone Encounter (Signed)
I discussed test result/ urine culture with patient. 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 He is now doing well on Keflex which the Ecoli is sensitive to. F/U as needed.

## 2015-07-11 ENCOUNTER — Telehealth: Payer: Self-pay | Admitting: Family Medicine

## 2015-07-11 ENCOUNTER — Other Ambulatory Visit (INDEPENDENT_AMBULATORY_CARE_PROVIDER_SITE_OTHER): Payer: Commercial Managed Care - HMO | Admitting: Family Medicine

## 2015-07-11 ENCOUNTER — Other Ambulatory Visit: Payer: Self-pay | Admitting: Family Medicine

## 2015-07-11 DIAGNOSIS — Z1211 Encounter for screening for malignant neoplasm of colon: Secondary | ICD-10-CM

## 2015-07-11 DIAGNOSIS — K5900 Constipation, unspecified: Secondary | ICD-10-CM

## 2015-07-11 LAB — POC HEMOCCULT BLD/STL (HOME/3-CARD/SCREEN)

## 2015-07-11 NOTE — Telephone Encounter (Signed)
Patient contacted about his FOBT report for colon cancer screening. Unfortunately per [employer], it gave an invalid report since the stool smear was too thick. Patient will see me back on 07/24/15. I will give him another stool card then. He agreed with plan.

## 2015-07-24 ENCOUNTER — Ambulatory Visit (INDEPENDENT_AMBULATORY_CARE_PROVIDER_SITE_OTHER): Payer: Commercial Managed Care - HMO | Admitting: Family Medicine

## 2015-07-24 ENCOUNTER — Encounter: Payer: Self-pay | Admitting: Family Medicine

## 2015-07-24 VITALS — BP 138/60 | HR 72 | Temp 97.6°F | Wt 183.0 lb

## 2015-07-24 DIAGNOSIS — N390 Urinary tract infection, site not specified: Secondary | ICD-10-CM

## 2015-07-24 DIAGNOSIS — K5900 Constipation, unspecified: Secondary | ICD-10-CM

## 2015-07-24 DIAGNOSIS — R634 Abnormal weight loss: Secondary | ICD-10-CM

## 2015-07-24 DIAGNOSIS — R319 Hematuria, unspecified: Secondary | ICD-10-CM

## 2015-07-24 DIAGNOSIS — I480 Paroxysmal atrial fibrillation: Secondary | ICD-10-CM | POA: Diagnosis not present

## 2015-07-24 DIAGNOSIS — E785 Hyperlipidemia, unspecified: Secondary | ICD-10-CM

## 2015-07-24 MED ORDER — LACTULOSE 10 GM/15ML PO SOLN: 10.0000 g | Freq: Every day | ORAL | Status: DC | PRN

## 2015-07-24 NOTE — Patient Instructions (Signed)
Constipation, Adult Constipation is when a person:  Poops (has a bowel movement) less than 3 times a week.  Has a hard time pooping.  Has poop that is dry, hard, or bigger than normal. HOME CARE   Eat foods with a lot of fiber in them. This includes fruits, vegetables, beans, and whole grains such as brown rice.  Avoid fatty foods and foods with a lot of sugar. This includes french fries, hamburgers, cookies, candy, and soda.  If you are not getting enough fiber from food, take products with added fiber in them (supplements).  Drink enough fluid to keep your pee (urine) clear or pale yellow.  Exercise on a regular basis, or as told by your doctor.  Go to the restroom when you feel like you need to poop. Do not hold it.  Only take medicine as told by your doctor. Do not take medicines that help you poop (laxatives) without talking to your doctor first. GET HELP RIGHT AWAY IF:   You have bright red blood in your poop (stool).  Your constipation lasts more than 4 days or gets worse.  You have belly (abdominal) or butt (rectal) pain.  You have thin poop (as thin as a pencil).  You lose weight, and it cannot be explained. MAKE SURE YOU:   Understand these instructions.  Will watch your condition.  Will get help right away if you are not doing well or get worse.   This information is not intended to replace advice given to you by your health care provider. Make sure you discuss any questions you have with your health care provider.   Document Released: 02/04/2008 Document Revised: 09/08/2014 Document Reviewed: 05/30/2013 Elsevier Interactive Patient Education 2016 Elsevier Inc.  

## 2015-07-24 NOTE — Assessment & Plan Note (Signed)
Gained 2 lbs since last visit. Continue monitoring.

## 2015-07-24 NOTE — Assessment & Plan Note (Signed)
Now in sinus rhythm. Continue Eliquis.

## 2015-07-24 NOTE — Assessment & Plan Note (Signed)
Patient seems to be diluting his lactulose before use. I recommended that he takes it as instructed. Continue high fiber diet. I again counseled him on colonoscopy but he declined. I gave another stool card for him to bring back for screening. F/U as needed.

## 2015-07-24 NOTE — Assessment & Plan Note (Signed)
Resolved

## 2015-07-24 NOTE — Progress Notes (Signed)
Subjective:     Patient ID: Brian Manning, male   DOB: [birth year], 81 y.o.   MRN: [id number]  HPI Afib: He denies SOB, no chest pain, no palpitation. He is compliant with eliquis. He denies bleeding or bruising. Constipation: Patient stated he is still having issue with his constipation. Last BM was yesterday after no bowel movement for 5 days. He uses the lactulose with minimal improvement. He stated he usually dilute his lactulose in half full glass of water before using it. UTI: He completed his Keflex. He is completely symptoms free. HLD: He is requesting that his Pravachol refill be sent to [facility] instead of [place]. He stated he does not need the refill now. Weight loss: he is compliant with diet plan. He drinks his ensure one bottle in the morning with his breakfast. He ate pizza toast with eggs this morning. For lunch he might eat half sub or sandwich with ham or [place], and vege juice. For dinner he will drink his usual vege drink blended with eggs and other food items.   Current Outpatient Prescriptions on File Prior to Visit  Medication Sig Dispense Refill  . apixaban (ELIQUIS) 5 MG TABS tablet Take 1 tablet (5 mg total) by mouth 2 (two) times daily. 60 tablet 11  . fish oil-omega-3 fatty acids 1000 MG capsule Take 1 g by mouth daily.     . folic acid (FOLVITE) Q000111Q MCG tablet Take 400 mcg by mouth daily.      . Multiple Vitamin (MULTIVITAMIN) tablet Take 1 tablet by mouth daily.      . pravastatin (PRAVACHOL) 40 MG tablet Take 40 mg by mouth daily.    Marland Kitchen EAR WAX REMOVAL DROPS 6.5 % otic solution      No current facility-administered medications on file prior to visit.   Past Medical History  Diagnosis Date  . Hyperlipidemia   . Hypertension   . Coronary artery disease      Review of Systems  Constitutional: Negative for appetite change and fatigue.  Respiratory: Negative.   Cardiovascular: Negative.   Gastrointestinal: Positive for constipation. Negative for nausea,  vomiting, abdominal pain, diarrhea, blood in stool, abdominal distention and rectal pain.  Neurological: Negative.   All other systems reviewed and are negative.      Filed Vitals:   07/24/15 1031  BP: 138/60  Pulse: 72  Temp: 97.6 F (36.4 C)  TempSrc: Oral  Weight: 183 lb (83.008 kg)    Objective:   Physical Exam  Constitutional: He is oriented to person, place, and time. He appears well-developed. No distress.  Cardiovascular: Normal rate, regular rhythm and normal heart sounds.   No murmur heard. Pulmonary/Chest: Effort normal and breath sounds normal. No respiratory distress. He has no wheezes.  Abdominal: Soft. Bowel sounds are normal. He exhibits no distension and no mass. There is no tenderness.  Musculoskeletal: Normal range of motion. He exhibits no edema.  Neurological: He is alert and oriented to person, place, and time.  Psychiatric: He has a normal mood and affect. His behavior is normal.  Nursing note and vitals reviewed.      Assessment:     Afib: Constipation: UTI: HLD: Weight loss:    Plan:     Check problem list.

## 2015-07-24 NOTE — Assessment & Plan Note (Signed)
Stable on Pravachol. Will refill when needed.

## 2015-07-30 ENCOUNTER — Other Ambulatory Visit (INDEPENDENT_AMBULATORY_CARE_PROVIDER_SITE_OTHER): Payer: Commercial Managed Care - HMO | Admitting: Family Medicine

## 2015-07-30 DIAGNOSIS — Z1211 Encounter for screening for malignant neoplasm of colon: Secondary | ICD-10-CM

## 2015-07-30 LAB — POC HEMOCCULT BLD/STL (HOME/3-CARD/SCREEN)
Card #2 Fecal Occult Blod, POC: NEGATIVE
Card #3 Fecal Occult Blood, POC: NEGATIVE
FECAL OCCULT BLD: NEGATIVE

## 2015-08-08 ENCOUNTER — Telehealth: Payer: Self-pay | Admitting: Family Medicine

## 2015-08-08 NOTE — Telephone Encounter (Signed)
I got a letter from [place] requesting release of medical information of patient for his last 4 visits. Patient signed document to release this information. I also contacted him to confirm this and he stated I should send his medical record to [place] so he can continue to get his medication refill from them. I will give a copy of this document to Beryle Lathe to complete release of medical record process.

## 2015-08-26 ENCOUNTER — Encounter: Payer: Self-pay | Admitting: Family Medicine

## 2015-08-26 ENCOUNTER — Other Ambulatory Visit (INDEPENDENT_AMBULATORY_CARE_PROVIDER_SITE_OTHER): Payer: Self-pay | Admitting: Family Medicine

## 2015-08-27 ENCOUNTER — Other Ambulatory Visit (INDEPENDENT_AMBULATORY_CARE_PROVIDER_SITE_OTHER): Payer: Self-pay | Admitting: Family Medicine

## 2015-08-28 ENCOUNTER — Encounter: Payer: Self-pay | Admitting: Family Medicine

## 2015-08-28 ENCOUNTER — Other Ambulatory Visit (INDEPENDENT_AMBULATORY_CARE_PROVIDER_SITE_OTHER): Payer: Self-pay | Admitting: Family Medicine

## 2015-08-29 ENCOUNTER — Other Ambulatory Visit: Payer: Self-pay | Admitting: Family Medicine

## 2015-08-29 MED ORDER — PRAVASTATIN SODIUM 40 MG PO TABS: 40.0000 mg | ORAL_TABLET | Freq: Every day | ORAL | Status: DC

## 2015-09-23 ENCOUNTER — Encounter: Payer: Self-pay | Admitting: Cardiology

## 2015-09-24 ENCOUNTER — Telehealth: Payer: Self-pay | Admitting: Family Medicine

## 2015-09-24 NOTE — Telephone Encounter (Signed)
Will forward to Robert in Lab to contact pt and explain need for 2 nd kit.

## 2015-09-24 NOTE — Telephone Encounter (Signed)
Pt wants to talk to dr Gwendlyn Deutscher about being double charged for a colerectal screening kit.  He wants to be credited for one of the kits.  He has "spent over 3 hrs on the phone talking to billing".  He is not paying for the first kit "because there was too much as poop" on the stick.   Please advise

## 2015-09-26 NOTE — Telephone Encounter (Signed)
Called patient and explained why he needed to collect a second set of stool cards. The first set given to him was collected incorrectly and needed to be recollected. Brian Manning is upset that he was charged for both sets of cards and is demanding a refund on one. He would like to speak to someone who can authorize a refund for him. Busick, Kevin Fenton

## 2015-10-04 ENCOUNTER — Encounter: Payer: Self-pay | Admitting: Cardiology

## 2015-10-04 ENCOUNTER — Ambulatory Visit (INDEPENDENT_AMBULATORY_CARE_PROVIDER_SITE_OTHER): Payer: Commercial Managed Care - HMO | Admitting: Cardiology

## 2015-10-04 VITALS — BP 140/80 | HR 76 | Ht 73.0 in | Wt 188.8 lb

## 2015-10-04 DIAGNOSIS — Z7901 Long term (current) use of anticoagulants: Secondary | ICD-10-CM | POA: Diagnosis not present

## 2015-10-04 DIAGNOSIS — I480 Paroxysmal atrial fibrillation: Secondary | ICD-10-CM | POA: Diagnosis not present

## 2015-10-04 DIAGNOSIS — I2583 Coronary atherosclerosis due to lipid rich plaque: Secondary | ICD-10-CM

## 2015-10-04 DIAGNOSIS — I2510 Atherosclerotic heart disease of native coronary artery without angina pectoris: Secondary | ICD-10-CM | POA: Diagnosis not present

## 2015-10-04 NOTE — Patient Instructions (Signed)
Medication Instructions:  The current medical regimen is effective;  continue present plan and medications.  Follow-Up: Follow up as needed with Dr Skains.  If you need a refill on your cardiac medications before your next appointment, please call your pharmacy.  Thank you for choosing [facility]!!     

## 2015-10-04 NOTE — Progress Notes (Signed)
Cardiology Office Note   Date:  10/04/2015   ID:  Brian Manning, DOB 1927/12/21, MRN [id number]  PCP:  Andrena Mews, MD  Cardiologist:   Candee Furbish, MD       History of Present Illness: Brian Manning is a 82 y.o. male who presents for follow-up of atrial fibrillation. Currently on Eliquis. Not having any bleeding episodes. Doing well.  36 years CABG [facility]. No cardiac catheterization post. Prior to CABG felt pressure, truck on chest. November, cold, walked out of dept. Store, felt poor. His cousin was doing a fellowship at [facility] and got admitted to the cardiologist. He went 5 seconds on the treadmill and failed the test. Angiogram revealed a 95% lesion. He underwent bypass surgery. He has been doing very well since then. He has been on atenolol since then until recently discontinued because of significant hypotension.  Retired [employer] previously, weak. Daughter noted this, after coming back from [place].   BP was 80/60. Atenolol stopped. Doxazosin also stopped. Had urinary tract infection/urosepsis.  At second visit, return visit he was found to have an irregular heartbeat. EKG confirmed atrial fibrillation, personally viewed.  Eliquis 8$, [facility] [place]. Feels great. Having frequent urination. Slight burning.      Past Medical History  Diagnosis Date  . Hyperlipidemia   . Hypertension   . Coronary artery disease     Past Surgical History  Procedure Laterality Date  . Hernia repair  2012  . US echocardiography  2012    pre-op for hernia: wnl  per patient  . Bladder polyp    . Left shoulder replacement Left 2014    Done at [facility]  . Coronary artery bypass graft      x1   1982  . Bunionectomy Right     Right foot  . Reverse shoulder arthroplasty Right 01/05/2015    Procedure: RIGHT REVERSE SHOULDER ARTHROPLASTY;  Surgeon: Netta Cedars, MD;  Location: [facility];  Service: Orthopedics;  Laterality: Right;     Current Outpatient  Prescriptions  Medication Sig Dispense Refill  . apixaban (ELIQUIS) 5 MG TABS tablet Take 1 tablet (5 mg total) by mouth 2 (two) times daily. 60 tablet 11  . EAR WAX REMOVAL DROPS 6.5 % otic solution Place 5 drops into both ears 2 (two) times daily as needed.     . fish oil-omega-3 fatty acids 1000 MG capsule Take 1 g by mouth daily.     . folic acid (FOLVITE) Q000111Q MCG tablet Take 400 mcg by mouth daily.      Marland Kitchen lactulose (CHRONULAC) 10 GM/15ML solution Take 15 mLs (10 g total) by mouth daily as needed for mild constipation. 120 mL 0  . Multiple Vitamin (MULTIVITAMIN) tablet Take 1 tablet by mouth daily.      . pravastatin (PRAVACHOL) 40 MG tablet Take 1 tablet (40 mg total) by mouth daily. 90 tablet 2   No current facility-administered medications for this visit.    Allergies:   Review of patient's allergies indicates no known allergies.    Social History:  The patient  reports that he has never smoked. He has never used smokeless tobacco. He reports that he drinks about 4.2 oz of alcohol per week. He reports that he does not use illicit drugs.   Family History:  The patient's family history includes Cancer (age of onset: 53) in his father; Diabetes in his mother; Stroke in his mother.    ROS:  Please see the history of present illness.   Otherwise, review of systems are positive for none.   All other systems are reviewed and negative.    PHYSICAL EXAM: VS:  BP 140/80 mmHg  Pulse 76  Ht 6\' 1"  (1.854 m)  Wt 188 lb 12.8 oz (85.639 kg)  BMI 24.91 kg/m2 , BMI Body mass index is 24.91 kg/(m^2). GEN: Well nourished, well developed, in no acute distressElderly but fit HEENT: normal Neck: no JVD, carotid bruits, or masses Cardiac: RRR; no murmurs, rubs, or gallops,no edema , rare ectopy Respiratory:  clear to auscultation bilaterally, normal work of breathing GI: soft, nontender, nondistended, + BS MS: no deformity or atrophy Skin: warm and dry, no rash Neuro:  Strength and sensation are  intact Psych: euthymic mood, full affect   EKG:  EKG today 06/28/15 - sinus rhythm, 71, nonspecific ST-T wave changes, no other abnormality. Prior EKG is a fibrillation with heart rate less than 100 bpm.  Recent Labs: 06/19/2015: BUN 27*; Creat 1.11; Hemoglobin 13.3; Platelets 218; Potassium 4.2; Sodium 133*; TSH 2.120    Lipid Panel    Component Value Date/Time   CHOL 106 06/13/2014 0902   TRIG 51 06/13/2014 0902   HDL 44 06/13/2014 0902   CHOLHDL 2.4 06/13/2014 0902   VLDL 10 06/13/2014 0902   LDLCALC 52 06/13/2014 0902      Wt Readings from Last 3 Encounters:  10/04/15 188 lb 12.8 oz (85.639 kg)  07/24/15 183 lb (83.008 kg)  07/06/15 181 lb (82.101 kg)    ECHO: 06/27/15 - Left ventricle: The cavity size was normal. There was mild focal basal hypertrophy of the septum. Systolic function was normal. The estimated ejection fraction was in the range of 55% to 60%. Wall motion was normal; there were no regional wall motion abnormalities. - Aortic valve: Trileaflet; mildly thickened, mildly calcified leaflets. There was trivial regurgitation. - Mitral valve: There was trivial regurgitation. - Right ventricle: Poorly visualized. - Tricuspid valve: There was mild regurgitation. - Pulmonary arteries: PA peak pressure: 34 mm Hg (S).  Other studies Reviewed: Additional studies/ records that were reviewed today include: EKGs, lab work, office notes reviewed. Review of the above records demonstrates: As above  Labs: at [place] 08/17/15-hemoglobin 14.4, platelets 210, sodium 141, potassium 4.5, creatinine 0.8, ALT 16 (Dr. Leodis Binet)   ASSESSMENT AND PLAN:  Atrial fibrillation, paroxysmal  - Diagnosed in the setting of viral illness with hypotension.  - Likely precipitated by recent weakness, possible viral illness.  - CHADS-Vasc 3  - Agree with anticoagulation Eliquis to 5 mg twice a day. His age is greater than 21 but his creatinine is less than 1.5 and his weight is  greater than 60 kg.  - I think that since we have seen atrial fibrillation now and confirmed it, given his age, there is a high likelihood that he may develop atrial fibrillation once again. I would like to continue with anticoagulation.  - Discussed risks of bleeding. Understands. No longer on aspirin 81 mg.  - Continue to monitor CBC and basic metabolic profile every 6 months. This will be done at the [facility]  - Echo reassuring.  Coronary artery disease status post bypass surgery  - Several years ago [facility]. He's been doing very well, no subsequent cardiac catheterizations.  - Beta blocker has been stopped because of hypotension.   Hypotension  - Resolved after stopping doxazosin and atenolol.  - I'm comfortable with him being off of these medications at this point.  Fatigue/weakness  - Resolved. He is feeling much better. He is back to himself.  Dysuria  - Asked him to have this evaluated by his family doctor. This could be an indication for chronic urinary tract infection. We do not want to see him in the same situation he was in previously with hypotension.  He would like to go ahead and continue with his care at the [facility]. I'm fine with this. We will be available as needed.   Current medicines are reviewed at length with the patient today.  The patient does not have concerns regarding medicines.  The following changes have been made:   Labs/ tests ordered today include:  No orders of the defined types were placed in this encounter.     Disposition:   FU with Tambria Pfannenstiel as needed  Bobby Rumpf, MD  10/04/2015 10:12 AM    [facility] [address], [place], [place]  [address] Phone: [phone]; Fax: [phone]

## 2016-03-16 ENCOUNTER — Other Ambulatory Visit: Payer: Self-pay | Admitting: Family Medicine

## 2016-04-09 ENCOUNTER — Encounter: Payer: Self-pay | Admitting: Family Medicine

## 2016-04-09 ENCOUNTER — Telehealth: Payer: Self-pay | Admitting: *Deleted

## 2016-04-09 ENCOUNTER — Ambulatory Visit (INDEPENDENT_AMBULATORY_CARE_PROVIDER_SITE_OTHER): Payer: Commercial Managed Care - HMO | Admitting: Family Medicine

## 2016-04-09 ENCOUNTER — Telehealth: Payer: Self-pay | Admitting: Cardiology

## 2016-04-09 VITALS — BP 87/53 | HR 81 | Temp 97.5°F | Wt 182.0 lb

## 2016-04-09 DIAGNOSIS — N400 Enlarged prostate without lower urinary tract symptoms: Secondary | ICD-10-CM | POA: Diagnosis not present

## 2016-04-09 DIAGNOSIS — R350 Frequency of micturition: Secondary | ICD-10-CM | POA: Diagnosis not present

## 2016-04-09 DIAGNOSIS — I951 Orthostatic hypotension: Secondary | ICD-10-CM | POA: Diagnosis not present

## 2016-04-09 DIAGNOSIS — I480 Paroxysmal atrial fibrillation: Secondary | ICD-10-CM | POA: Diagnosis not present

## 2016-04-09 LAB — CBC WITH DIFFERENTIAL/PLATELET
BASOS PCT: 0 %
Basophils Absolute: 0 cells/uL (ref 0–200)
Eosinophils Absolute: 94 cells/uL (ref 15–500)
Eosinophils Relative: 2 %
HEMATOCRIT: 46.2 % (ref 38.5–50.0)
Hemoglobin: 15.5 g/dL (ref 13.2–17.1)
LYMPHS PCT: 26 %
Lymphs Abs: 1222 cells/uL (ref 850–3900)
MCH: 31.7 pg (ref 27.0–33.0)
MCHC: 33.5 g/dL (ref 32.0–36.0)
MCV: 94.5 fL (ref 80.0–100.0)
MONO ABS: 611 {cells}/uL (ref 200–950)
MONOS PCT: 13 %
MPV: 9.9 fL (ref 7.5–12.5)
NEUTROS ABS: 2773 {cells}/uL (ref 1500–7800)
Neutrophils Relative %: 59 %
PLATELETS: 207 10*3/uL (ref 140–400)
RBC: 4.89 MIL/uL (ref 4.20–5.80)
RDW: 14.1 % (ref 11.0–15.0)
WBC: 4.7 10*3/uL (ref 3.8–10.8)

## 2016-04-09 NOTE — Telephone Encounter (Signed)
Patient may come in to see me today at 4pm. I will create time to see him then. However if symptoms worsens please advise to go to the ED.  Please have him come with all his home medications for review if he will be coming in to see me today. Thanks.

## 2016-04-09 NOTE — Telephone Encounter (Signed)
Spoke with nurse Jackelyn Poling) from [facility] and Wellness program at the [facility] re: pt.  She is reporting he "wasn't himself, was cool, clammy and pale."  She took his BP 80/60's HR 90's and irregular.  She had called his PCP who instructed her to call here.  Pt has been taking medications as listed and is on Eliquis.  He has a history of PAF.  Advised to increase fluid intake-NL EF.  He is not on any beta-blockers or medications that should be adjusted d/t this episode of hypotension.   He will need to continue to monitor his HR/BP and report any further s/s to the office or his PCP.  Requested he have someone drive him home today.  If s/s worsen he should call 911 and report to the ED for further evaluation.  She states understanding and will notify pt.

## 2016-04-09 NOTE — Progress Notes (Signed)
Subjective:     Patient ID: Brian Manning, male   DOB: 1928-01-29, 82 y.o.   MRN: [id number]  Dizziness  This is a new problem. The current episode started today (Patient went to the [facility] to exercise. Few minutes later he started feeling dizzy. The nurse at the [facility] checked his BP and it was in the [id number] systolic.). The problem occurs intermittently (Occurs mostly with standing). The problem has been waxing and waning. Pertinent negatives include no abdominal pain, anorexia, change in bowel habit, chest pain, congestion, coughing, diaphoresis, fatigue, fever, headaches, vertigo, visual change or vomiting. Associated symptoms comments: No fall or LOC. No palpitation. The symptoms are aggravated by standing. He has tried nothing for the symptoms.  He is not on antihypertensive agents. He was started on Flomax 4 months ago by his Dr. At the [place] for increase urine frequency due to BPH. BPH: Stated this has been an issue for years. He was recently started on Flomax, plan was to try this and if not helpful to refer to Urologist. This medication was started at the [place]. He stated it did not help with his urine frequency. He denies straining or feeling or urine retention. No GI symptoms. Appetite is good.  Current Outpatient Prescriptions on File Prior to Visit  Medication Sig Dispense Refill  . apixaban (ELIQUIS) 5 MG TABS tablet Take 1 tablet (5 mg total) by mouth 2 (two) times daily. 60 tablet 11  . fish oil-omega-3 fatty acids 1000 MG capsule Take 1 g by mouth daily.     . folic acid (FOLVITE) Q000111Q MCG tablet Take 400 mcg by mouth daily.      . Multiple Vitamin (MULTIVITAMIN) tablet Take 1 tablet by mouth daily.      . pravastatin (PRAVACHOL) 40 MG tablet TAKE 1 TABLET EVERY DAY 90 tablet 2  . lactulose (CHRONULAC) 10 GM/15ML solution Take 15 mLs (10 g total) by mouth daily as needed for mild constipation. (Patient not taking: Reported on 04/09/2016) 120 mL 0   No current facility-administered medications on  file prior to visit.    Past Medical History:  Diagnosis Date  . Coronary artery disease   . Hyperlipidemia   . Hypertension     Vitals:   04/09/16 1604 04/09/16 1607 04/09/16 1610  BP: 117/71 137/68 (!) 87/53  Pulse: 74 89 81  Temp: 97.5 F (36.4 C)    SpO2: 99%    Weight: 182 lb (82.6 kg)        Review of Systems  Constitutional: Negative for diaphoresis, fatigue and fever.  HENT: Negative for congestion.   Respiratory: Negative for cough.   Cardiovascular: Negative for chest pain.  Gastrointestinal: Negative for abdominal pain, anorexia, change in bowel habit and vomiting.  Neurological: Positive for dizziness. Negative for vertigo and headaches.  All other systems reviewed and are negative.      Objective:   Physical Exam  Constitutional: He is oriented to person, place, and time. He appears well-developed.  Non-toxic appearance. He does not have a sickly appearance. He does not appear ill. No distress.  HENT:  Head: Normocephalic.  Mouth/Throat: Oropharynx is clear and moist. Mucous membranes are not pale, not dry and not cyanotic.  Eyes: Conjunctivae and EOM are normal.  Neck: Neck supple.  Cardiovascular: Normal rate, normal heart sounds and normal pulses.  An irregularly irregular rhythm present.  Pulmonary/Chest: Breath sounds normal. He has no wheezes.  Musculoskeletal: Normal range of motion. He exhibits no edema.  Neurological: He  is alert and oriented to person, place, and time. No cranial nerve deficit. Coordination normal.  Gait slow but well coordinated.  Nursing note and vitals reviewed.      Assessment:     Dizziness Hypotension BPH    Plan:     Dizziness likely due to low BP. Orthostatic BP measurement positive. As discussed with patient Flomax will cause orthostatic hypotension. He is advised to stop Flomax. The bottle was taken from him and given to Jazmine to dispose of it. Continue other home regimen. Avoid antihypertensive agents  given recurrent hx of hypotension. Keep self well hydrated. Instruction given on slowly getting up from lying or sitting position to standing.  Fall precaution discussed. He verbalized understand. He is aware to go to the ED if symptoms worsens.  For his BPH: Stop flomax. Not a candidate for Cialis or Finasteride which will also cause orthostatic hypotension. I referred him to the urologist. F/U as needed.     Chronic condition: Afib, HLD, continue current home regimen.

## 2016-04-09 NOTE — Patient Instructions (Signed)

## 2016-04-09 NOTE — Telephone Encounter (Signed)
New message    Health and wellness RN [employer] with [facility]   Pt came to her and Verbalized that pt was cool and clammy no pain    Pt c/o BP issue: STAT if pt c/o blurred vision, one-sided weakness or slurred speech  1. What are your last 5 BP readings? Sugar 135 BP 80/60 Hr 90 palpated   2. Are you having any other symptoms (ex. Dizziness, headache, blurred vision, passed out)?   lightheadedness  3. What is your BP issue? low

## 2016-04-09 NOTE — Telephone Encounter (Signed)
Thanks

## 2016-04-09 NOTE — Telephone Encounter (Signed)
Spoke with patient and he has agreed to see Dr. Gwendlyn Deutscher today at 4 and will bring medications.  Pt asked how he was feeling and he stated he feels pretty good at the moment.  Will forward to Dr. Gwendlyn Deutscher.  Derl Barrow, RN

## 2016-04-09 NOTE — Telephone Encounter (Signed)
Appointment slop has been made for patient at 4 today.  Derl Barrow, RN

## 2016-04-09 NOTE — Telephone Encounter (Signed)
Debbie, Wellness RN at [employer] called to report that patient was in her office with symptoms of cool, pale and clammy skin color, lightheadedness and blood pressure of 80s/60s.  Blood sugar was 135.  She stated she was going to call EMS, but patient refused and wanted to see Dr. Gwendlyn Deutscher.  Informed her that Dr. Gwendlyn Deutscher would not be in clinic this week; she is attending at the hospital.  Pt has not been seen at [facility] since 07/2015 and last visit with cardiologist was 10/2015.  Advised her and patient that he needed to follow up with cardiologist ASAP or go to ED.  Offered an appointment with same day provider, but Debbie refused stating she did not want patient driving due to lightheadedness.  Advised again patient should be seen in the ED and to call for EMS.  Jackelyn Poling stated she was going to give patient's cardiologist a call.  Will forward to PCP.  Derl Barrow, RN

## 2016-04-10 ENCOUNTER — Encounter: Payer: Self-pay | Admitting: *Deleted

## 2016-04-10 ENCOUNTER — Encounter: Payer: Self-pay | Admitting: Family Medicine

## 2016-04-10 LAB — COMPLETE METABOLIC PANEL WITH GFR
ALT: 10 U/L (ref 9–46)
AST: 18 U/L (ref 10–35)
Albumin: 4.2 g/dL (ref 3.6–5.1)
Alkaline Phosphatase: 53 U/L (ref 40–115)
BUN: 17 mg/dL (ref 7–25)
CHLORIDE: 105 mmol/L (ref 98–110)
CO2: 23 mmol/L (ref 20–31)
CREATININE: 0.95 mg/dL (ref 0.70–1.11)
Calcium: 9.4 mg/dL (ref 8.6–10.3)
GFR, Est African American: 83 mL/min (ref >=60)
GFR, Est Non African American: 72 mL/min (ref >=60)
Glucose, Bld: 117 mg/dL — ABNORMAL HIGH (ref 65–99)
Potassium: 4.8 mmol/L (ref 3.5–5.3)
Sodium: 140 mmol/L (ref 135–146)
Total Bilirubin: 0.6 mg/dL (ref 0.2–1.2)
Total Protein: 6.8 g/dL (ref 6.1–8.1)

## 2016-04-10 NOTE — Telephone Encounter (Signed)
Spoke with pt who saw Dr Gwendlyn Deutscher for further evaluation yesterday, who stopped his Flomax.  Pt states he is feeling great today.  BP 110/60 and is having no problems or concerns at this time.  He will hold off from any further testing at this time.

## 2016-04-10 NOTE — Telephone Encounter (Signed)
Agree with hydration. Previously had UTI/urosepsis with low BP. Please have him see his PCP to check U/A. Also would recommend CBC (make sure he is not bleeding on Eliquis)  Candee Furbish, MD

## 2016-04-10 NOTE — Telephone Encounter (Signed)
Left message for pt on home VM of Dr Marlou Porch recommendations and to c/b with any questions or concerns.

## 2016-05-15 IMAGING — CR DG SHOULDER 2+V PORT*R*
1 series · 1 of 1 positions shown · non-contrast
Comparison: None.

CLINICAL DATA: Postop from right shoulder replacement.

EXAM:
PORTABLE RIGHT SHOULDER - 2+ VIEW

[AP]
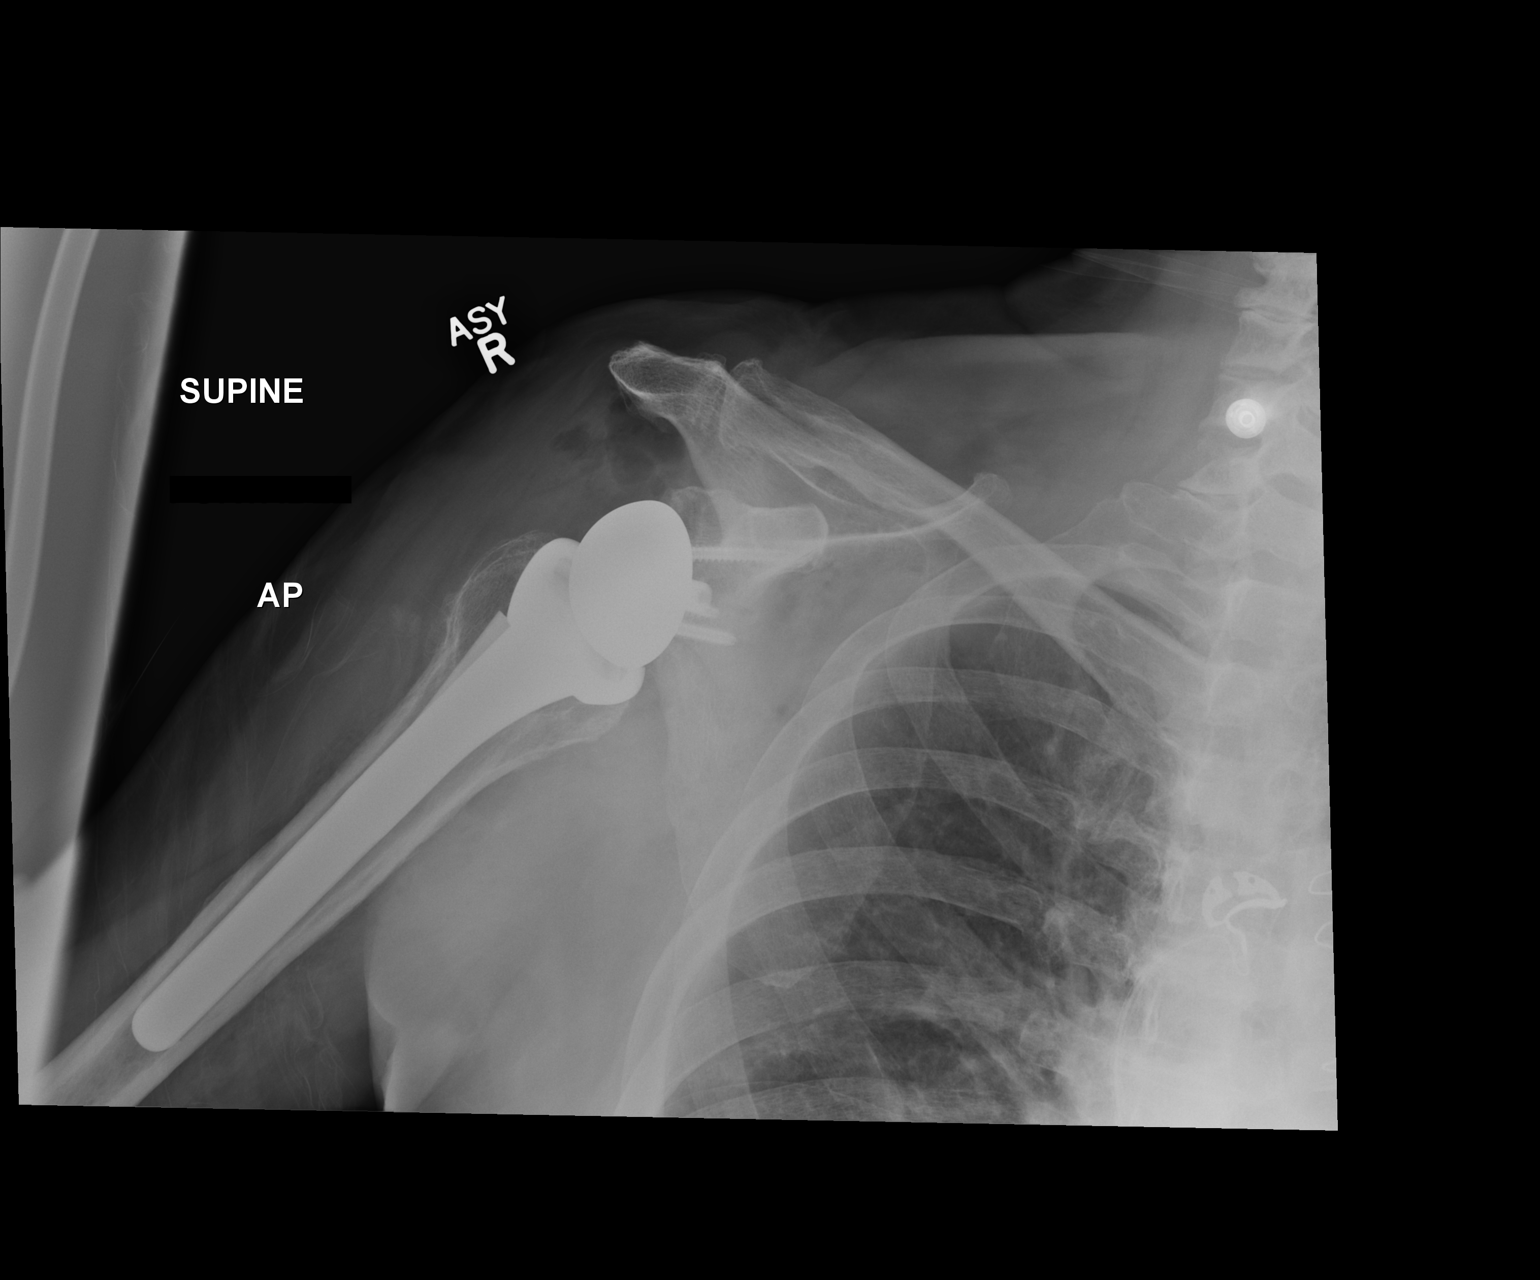

[1 of 1 positions shown; findings below may reference images not displayed]

FINDINGS: Right shoulder prosthesis seen in expected position. No evidence of
fracture or dislocation. Postoperative soft tissue gas noted.
IMPRESSION: Expected postoperative appearance of right shoulder prosthesis. No
evidence of fracture or dislocation.

## 2016-10-16 ENCOUNTER — Other Ambulatory Visit: Payer: Self-pay | Admitting: Family Medicine

## 2016-10-30 DIAGNOSIS — R69 Illness, unspecified: Secondary | ICD-10-CM | POA: Diagnosis not present

## 2016-12-09 ENCOUNTER — Encounter: Payer: Self-pay | Admitting: Family Medicine

## 2016-12-09 ENCOUNTER — Encounter: Payer: Self-pay | Admitting: *Deleted

## 2016-12-09 ENCOUNTER — Ambulatory Visit (INDEPENDENT_AMBULATORY_CARE_PROVIDER_SITE_OTHER): Payer: Medicare HMO | Admitting: Family Medicine

## 2016-12-09 ENCOUNTER — Ambulatory Visit (INDEPENDENT_AMBULATORY_CARE_PROVIDER_SITE_OTHER): Payer: Medicare HMO | Admitting: *Deleted

## 2016-12-09 VITALS — BP 136/68 | HR 66 | Temp 97.5°F | Ht 71.5 in | Wt 184.4 lb

## 2016-12-09 VITALS — BP 118/64 | HR 67 | Temp 98.1°F | Ht 71.5 in | Wt 184.6 lb

## 2016-12-09 DIAGNOSIS — Z23 Encounter for immunization: Secondary | ICD-10-CM

## 2016-12-09 DIAGNOSIS — Z0000 Encounter for general adult medical examination without abnormal findings: Secondary | ICD-10-CM

## 2016-12-09 NOTE — Progress Notes (Signed)
Subjective:     Brian Manning is a 83 y.o. male and is here for a comprehensive physical exam. The patient reports no problems.  Social History   Social History  . Marital status: Divorced    Spouse name: N/A  . Number of children: N/A  . Years of education: N/A   Occupational History  . retired    Social History Main Topics  . Smoking status: Never Smoker  . Smokeless tobacco: Never Used  . Alcohol use 4.2 oz/week    7 Shots of liquor per week  . Drug use: No  . Sexual activity: Not on file   Other Topics Concern  . Not on file   Social History Narrative   Retired Engineer, maintenance (IT).  Divorced- ex wife now passed away.  Lives alone- has two children in [place].  College graduate.   Health Maintenance  Topic Date Due  . TETANUS/TDAP  12/24/1946  . PNA vac Low Risk Adult (2 of 2 - PPSV23) 05/06/2015  . INFLUENZA VACCINE  04/01/2017       Review of Systems Pertinent items are noted in HPI.   Objective:    BP 118/64   Pulse 67   Temp 98.1 F (36.7 C) (Oral)   Ht 5' 11.5" (1.816 m)   Wt 184 lb 9.6 oz (83.7 kg)   SpO2 99%   BMI 25.39 kg/m  General appearance: alert, cooperative and appears stated age Head: Normocephalic, without obvious abnormality, atraumatic Eyes: conjunctivae/corneas clear. PERRL, EOM's intact. Fundi benign. Ears: normal TM's and external ear canals both ears Throat: lips, mucosa, and tongue normal; teeth and gums normal Neck: no adenopathy, no carotid bruit, no JVD, supple, symmetrical, trachea midline and thyroid not enlarged, symmetric, no tenderness/mass/nodules Lungs: clear to auscultation bilaterally Heart: regular rate and rhythm, S1, S2 normal, no murmur, click, rub or gallop Abdomen: soft, non-tender; bowel sounds normal; no masses,  no organomegaly Extremities: extremities normal, atraumatic, no cyanosis or edema Skin: Skin color, texture, turgor normal. No rashes or lesions or some age related skin changes otherwise normal  appearing Neurologic: Alert and oriented X 3, normal strength and tone. Normal symmetric reflexes. Normal coordination and gait Mental status: Alert, oriented, thought content appropriate, alertness: alert, orientation: time, person, place, affect: normal Motor: grossly normal Gait: Normal    Assessment:    Healthy male exam.      Plan:    Normal Exam. Medication reviewed. No S/E to meds. Pneumovax given today after counseling. F/U in 4 months for lab work.

## 2016-12-09 NOTE — Progress Notes (Signed)
Subjective:   Brian Manning is a 83 y.o. male who presents for an Initial Medicare Annual Wellness Visit.  Cardiac Risk Factors include: advanced age (>42men, >46 women);dyslipidemia;hypertension;male gender    Objective:    Today's Vitals   12/09/16 1126  BP: 136/68  Pulse: 66  Temp: 97.5 F (36.4 C)  TempSrc: Oral  SpO2: 97%  PainSc: 0-No pain  BMI 25.4  Current Medications (verified) Outpatient Encounter Prescriptions as of 12/09/2016  Medication Sig  . apixaban (ELIQUIS) 5 MG TABS tablet Take 1 tablet (5 mg total) by mouth 2 (two) times daily.  Marland Kitchen ENSURE PLUS (ENSURE PLUS) LIQD Take 1 Can by mouth.  . fish oil-omega-3 fatty acids 1000 MG capsule Take 1 g by mouth daily.   . folic acid (FOLVITE) 425 MCG tablet Take 400 mcg by mouth daily.    . Multiple Vitamin (MULTIVITAMIN) tablet Take 1 tablet by mouth daily.    . pravastatin (PRAVACHOL) 40 MG tablet TAKE 1 TABLET EVERY DAY  . lactulose (CHRONULAC) 10 GM/15ML solution Take 15 mLs (10 g total) by mouth daily as needed for mild constipation. (Patient not taking: Reported on 12/09/2016)   No facility-administered encounter medications on file as of 12/09/2016.     Allergies (verified) Patient has no known allergies.   History: Past Medical History:  Diagnosis Date  . Coronary artery disease   . Hyperlipidemia   . Hypertension    Past Surgical History:  Procedure Laterality Date  . bladder polyp    . BUNIONECTOMY Right    Right foot  . CORONARY ARTERY BYPASS GRAFT     x1   1982  . HERNIA REPAIR  2012  . left shoulder replacement Left 2014   Done at [facility]  . REVERSE SHOULDER ARTHROPLASTY Right 01/05/2015   Procedure: RIGHT REVERSE SHOULDER ARTHROPLASTY;  Surgeon: Netta Cedars, MD;  Location: [facility];  Service: Orthopedics;  Laterality: Right;  . US ECHOCARDIOGRAPHY  2012   pre-op for hernia: wnl  per patient   Family History  Problem Relation Age of Onset  . Diabetes Mother   . Stroke Mother   . Cancer  Father 35    prostate ca  . Obesity Daughter    Social History   Occupational History  . retired    Social History Main Topics  . Smoking status: Never Smoker  . Smokeless tobacco: Never Used  . Alcohol use 0.6 oz/week    1 Shots of liquor per week     Comment: Less than 1 drink per week  . Drug use: No  . Sexual activity: No   Tobacco Counseling Patient has never smoked and has no plans to start.  Activities of Daily Living In your present state of health, do you have any difficulty performing the following activities: 12/09/2016  Hearing? Y  Vision? N  Difficulty concentrating or making decisions? N  Walking or climbing stairs? N  Dressing or bathing? N  Doing errands, shopping? N  Preparing Food and eating ? N  Using the Toilet? N  In the past six months, have you accidently leaked urine? Y  Do you have problems with loss of bowel control? N  Managing your Medications? N  Managing your Finances? N  Housekeeping or managing your Housekeeping? N  Some recent data might be hidden   Home Safety:  My home has a working smoke alarm:  Yes X 1           My home  throw rugs have been fastened down to the floor or removed:  One remnant with non-slip back I have non-slip mats in the bathtub and shower:  Yes and grab bars         All my home's stairs have railings or bannisters: One level home with 1-3 outside steps without handrails. Patient does not feel this is a fall risk         My home's floors, stairs and hallways are free from clutter, wires and cords:  Yes     I wear seatbelts consistently:  Yes   Immunizations and Health Maintenance Immunization History  Administered Date(s) Administered  . Influenza,inj,Quad PF,36+ Mos 06/13/2014  . Influenza-Unspecified 06/01/2013, 06/02/2015  . Pneumococcal Conjugate-13 05/05/2014  . Pneumococcal Polysaccharide-23 12/09/2016   Health Maintenance Due  Topic Date Due  . TETANUS/TDAP  12/24/1946  Discussed receiving this at  local pharmacy  Patient Care Team: Kinnie Feil, MD as PCP - General (Family Medicine) Jerline Pain, MD as Consulting Physician (Cardiology)  Indicate any recent Medical Services you may have received from other than [facility] providers in the past year (date may be approximate).    Assessment:   This is a routine wellness examination for Brian Manning.   Hearing/Vision screen  Hearing Screening   Method: Audiometry   125Hz  250Hz  500Hz  1000Hz  2000Hz  3000Hz  4000Hz  6000Hz  8000Hz   Right ear:   Fail Fail Fail  Fail    Left ear:   Fail Fail Fail  Fail    Discussed speaking with PCP for possible evaluation for hearing aids.  Dietary issues and exercise activities discussed: Current Exercise Habits: Structured exercise class ([employer]), Type of exercise: calisthenics;strength training/weights;walking, Time (Minutes): 30, Frequency (Times/Week): 7, Weekly Exercise (Minutes/Week): 210, Intensity: Mild, Exercise limited by: None identified  Goals    . Continue going to [employer] daily for 30 minutes activity (pt-stated)      Depression Screen PHQ 2/9 Scores 12/09/2016 12/09/2016 07/24/2015 07/06/2015  PHQ - 2 Score 0 0 0 0    Fall Risk Fall Risk  12/09/2016 12/09/2016 07/24/2015 07/06/2015 06/19/2015  Falls in the past year? No No Yes Yes Yes  Number falls in past yr: - - 2 or more 2 or more 2 or more  Injury with Fall? - - - No No  Risk Factor Category  - - - High Fall Risk High Fall Risk   TUG Test:  Done in 9 seconds.   Cognitive Function: Mini-Cog  Passed with score 4/5  Cognitive Function: MMSE - Mini Mental State Exam 06/13/2014  Orientation to time 5  Orientation to Place 5  Registration 3  Attention/ Calculation 5  Recall 3  Language- name 2 objects 2  Language- repeat 1  Language- follow 3 step command 3  Language- read & follow direction 1  Write a sentence 1  Copy design 1  Total score 30        Screening Tests Health Maintenance  Topic Date Due  . TETANUS/TDAP   12/24/1946  . INFLUENZA VACCINE  04/01/2017  . PNA vac Low Risk Adult  Completed  Discussed receiving TDaP at local pharmacy Flu vaccine received 06/01/2016 at the [facility] office  Pneumovax given today.  Plan:     During the course of the visit [place] was educated and counseled about the following appropriate screening and preventive services:   Vaccines to include Pneumoccal, Influenza, Td, Zostavax/Shingrix  Colorectal cancer screening  Cardiovascular disease screening  Diabetes screening  Nutrition counseling   Patient  Instructions (the written plan) were given to the patient.   Velora Heckler, RN   12/09/2016

## 2016-12-09 NOTE — Patient Instructions (Signed)
Fall Prevention in the Home Falls can cause injuries. They can happen to people of all ages. There are many things you can do to make your home safe and to help prevent falls. What can I do on the outside of my home?  Regularly fix the edges of walkways and driveways and fix any cracks.  Remove anything that might make you trip as you walk through a door, such as a raised step or threshold.  Trim any bushes or trees on the path to your home.  Use bright outdoor lighting.  Clear any walking paths of anything that might make someone trip, such as rocks or tools.  Regularly check to see if handrails are loose or broken. Make sure that both sides of any steps have handrails.  Any raised decks and porches should have guardrails on the edges.  Have any leaves, snow, or ice cleared regularly.  Use sand or salt on walking paths during winter.  Clean up any spills in your garage right away. This includes oil or grease spills. What can I do in the bathroom?  Use night lights.  Install grab bars by the toilet and in the tub and shower. Do not use towel bars as grab bars.  Use non-skid mats or decals in the tub or shower.  If you need to sit down in the shower, use a plastic, non-slip stool.  Keep the floor dry. Clean up any water that spills on the floor as soon as it happens.  Remove soap buildup in the tub or shower regularly.  Attach bath mats securely with double-sided non-slip rug tape.  Do not have throw rugs and other things on the floor that can make you trip. What can I do in the bedroom?  Use night lights.  Make sure that you have a light by your bed that is easy to reach.  Do not use any sheets or blankets that are too big for your bed. They should not hang down onto the floor.  Have a firm chair that has side arms. You can use this for support while you get dressed.  Do not have throw rugs and other things on the floor that can make you trip. What can I do in  the kitchen?  Clean up any spills right away.  Avoid walking on wet floors.  Keep items that you use a lot in easy-to-reach places.  If you need to reach something above you, use a strong step stool that has a grab bar.  Keep electrical cords out of the way.  Do not use floor polish or wax that makes floors slippery. If you must use wax, use non-skid floor wax.  Do not have throw rugs and other things on the floor that can make you trip. What can I do with my stairs?  Do not leave any items on the stairs.  Make sure that there are handrails on both sides of the stairs and use them. Fix handrails that are broken or loose. Make sure that handrails are as long as the stairways.  Check any carpeting to make sure that it is firmly attached to the stairs. Fix any carpet that is loose or worn.  Avoid having throw rugs at the top or bottom of the stairs. If you do have throw rugs, attach them to the floor with carpet tape.  Make sure that you have a light switch at the top of the stairs and the bottom of the stairs. If you  do not have them, ask someone to add them for you. What else can I do to help prevent falls?  Wear shoes that:  Do not have high heels.  Have rubber bottoms.  Are comfortable and fit you well.  Are closed at the toe. Do not wear sandals.  If you use a stepladder:  Make sure that it is fully opened. Do not climb a closed stepladder.  Make sure that both sides of the stepladder are locked into place.  Ask someone to hold it for you, if possible.  Clearly mark and make sure that you can see:  Any grab bars or handrails.  First and last steps.  Where the edge of each step is.  Use tools that help you move around (mobility aids) if they are needed. These include:  Canes.  Walkers.  Scooters.  Crutches.  Turn on the lights when you go into a dark area. Replace any light bulbs as soon as they burn out.  Set up your furniture so you have a clear  path. Avoid moving your furniture around.  If any of your floors are uneven, fix them.  If there are any pets around you, be aware of where they are.  Review your medicines with your doctor. Some medicines can make you feel dizzy. This can increase your chance of falling. Ask your doctor what other things that you can do to help prevent falls. This information is not intended to replace advice given to you by your health care provider. Make sure you discuss any questions you have with your health care provider. Document Released: 06/14/2009 Document Revised: 01/24/2016 Document Reviewed: 09/22/2014 Elsevier Interactive Patient Education  2017 [employer] Maintenance, Male A healthy lifestyle and preventive care is important for your health and wellness. Ask your health care provider about what schedule of regular examinations is right for you. What should I know about weight and diet?  Eat a Healthy Diet  Eat plenty of vegetables, fruits, whole grains, low-fat dairy products, and lean protein.  Do not eat a lot of foods high in solid fats, added sugars, or salt. Maintain a Healthy Weight  Regular exercise can help you achieve or maintain a healthy weight. You should:  Do at least 150 minutes of exercise each week. The exercise should increase your heart rate and make you sweat (moderate-intensity exercise).  Do strength-training exercises at least twice a week. Watch Your Levels of Cholesterol and Blood Lipids  Have your blood tested for lipids and cholesterol every 5 years starting at 81 years of age. If you are at high risk for heart disease, you should start having your blood tested when you are 81 years old. You may need to have your cholesterol levels checked more often if:  Your lipid or cholesterol levels are high.  You are older than 81 years of age.  You are at high risk for heart disease. What should I know about cancer screening? Many types of cancers can  be detected early and may often be prevented. Lung Cancer  You should be screened every year for lung cancer if:  You are a current smoker who has smoked for at least 30 years.  You are a former smoker who has quit within the past 15 years.  Talk to your health care provider about your screening options, when you should start screening, and how often you should be screened. Colorectal Cancer  Routine colorectal cancer screening usually begins at 81 years  of age and should be repeated every 5-10 years until you are 81 years old. You may need to be screened more often if early forms of precancerous polyps or small growths are found. Your health care provider may recommend screening at an earlier age if you have risk factors for colon cancer.  Your health care provider may recommend using home test kits to check for hidden blood in the stool.  A small camera at the end of a tube can be used to examine your colon (sigmoidoscopy or colonoscopy). This checks for the earliest forms of colorectal cancer. Prostate and Testicular Cancer  Depending on your age and overall health, your health care provider may do certain tests to screen for prostate and testicular cancer.  Talk to your health care provider about any symptoms or concerns you have about testicular or prostate cancer. Skin Cancer  Check your skin from head to toe regularly.  Tell your health care provider about any new moles or changes in moles, especially if:  There is a change in a mole's size, shape, or color.  You have a mole that is larger than a pencil eraser.  Always use sunscreen. Apply sunscreen liberally and repeat throughout the day.  Protect yourself by wearing long sleeves, pants, a wide-brimmed hat, and sunglasses when outside. What should I know about heart disease, diabetes, and high blood pressure?  If you are 24-24 years of age, have your blood pressure checked every 3-5 years. If you are 63 years of age or  older, have your blood pressure checked every year. You should have your blood pressure measured twice-once when you are at a hospital or clinic, and once when you are not at a hospital or clinic. Record the average of the two measurements. To check your blood pressure when you are not at a hospital or clinic, you can use:  An automated blood pressure machine at a pharmacy.  A home blood pressure monitor.  Talk to your health care provider about your target blood pressure.  If you are between 76-79 years old, ask your health care provider if you should take aspirin to prevent heart disease.  Have regular diabetes screenings by checking your fasting blood sugar level.  If you are at a normal weight and have a low risk for diabetes, have this test once every three years after the age of 59.  If you are overweight and have a high risk for diabetes, consider being tested at a younger age or more often.  A one-time screening for abdominal aortic aneurysm (AAA) by ultrasound is recommended for men aged 54-75 years who are current or former smokers. What should I know about preventing infection? Hepatitis B  If you have a higher risk for hepatitis B, you should be screened for this virus. Talk with your health care provider to find out if you are at risk for hepatitis B infection. Hepatitis C  Blood testing is recommended for:  Everyone born from [birth year] through 1963.  Anyone with known risk factors for hepatitis C. Sexually Transmitted Diseases (STDs)  You should be screened each year for STDs including gonorrhea and chlamydia if:  You are sexually active and are younger than 81 years of age.  You are older than 81 years of age and your health care provider tells you that you are at risk for this type of infection.  Your sexual activity has changed since you were last screened and you are at an increased risk for chlamydia  or gonorrhea. Ask your health care provider if you are at  risk.  Talk with your health care provider about whether you are at high risk of being infected with HIV. Your health care provider may recommend a prescription medicine to help prevent HIV infection. What else can I do?  Schedule regular health, dental, and eye exams.  Stay current with your vaccines (immunizations).  Do not use any tobacco products, such as cigarettes, chewing tobacco, and e-cigarettes. If you need help quitting, ask your health care provider.  Limit alcohol intake to no more than 2 drinks per day. One drink equals 12 ounces of beer, 5 ounces of wine, or 1 ounces of hard liquor.  Do not use street drugs.  Do not share needles.  Ask your health care provider for help if you need support or information about quitting drugs.  Tell your health care provider if you often feel depressed.  Tell your health care provider if you have ever been abused or do not feel safe at home. This information is not intended to replace advice given to you by your health care provider. Make sure you discuss any questions you have with your health care provider. Document Released: 02/14/2008 Document Revised: 04/16/2016 Document Reviewed: 05/22/2015 Elsevier Interactive Patient Education  2017 Elsevier Inc.   Hearing Loss Hearing loss is a partial or total loss of the ability to hear. This can be temporary or permanent, and it can happen in one or both ears. Hearing loss may be referred to as deafness. Medical care is necessary to treat hearing loss properly and to prevent the condition from getting worse. Your hearing may partially or completely come back, depending on what caused your hearing loss and how severe it is. In some cases, hearing loss is permanent. What are the causes? Common causes of hearing loss include:  Too much wax in the ear canal.  Infection of the ear canal or middle ear.  Fluid in the middle ear.  Injury to the ear or surrounding area.  An object stuck in  the ear.  Prolonged exposure to loud sounds, such as music. Less common causes of hearing loss include:  Tumors in the ear.  Viral or bacterial infections, such as meningitis.  A hole in the eardrum (perforated eardrum).  Problems with the hearing nerve that sends signals between the brain and the ear.  Certain medicines. What are the signs or symptoms? Symptoms of this condition may include:  Difficulty telling the difference between sounds.  Difficulty following a conversation when there is background noise.  Lack of response to sounds in your environment. This may be most noticeable when you do not respond to startling sounds.  Needing to turn up the volume on the television, radio, etc.  Ringing in the ears.  Dizziness.  Pain in the ears. How is this diagnosed? This condition is diagnosed based on a physical exam and a hearing test (audiometry). The audiometry test will be performed by a hearing specialist (audiologist). You may also be referred to an ear, nose, and throat (ENT) specialist (otolaryngologist). How is this treated? Treatment for recent onset of hearing loss may include:  Ear wax removal.  Being prescribed medicines to prevent infection (antibiotics).  Being prescribed medicines to reduce inflammation (corticosteroids). Follow these instructions at home:  If you were prescribed an antibiotic medicine, take it as told by your health care provider. Do not stop taking the antibiotic even if you start to feel better.  Take over-the-counter  and prescription medicines only as told by your health care provider.  Avoid loud noises.  Return to your normal activities as told by your health care provider. Ask your health care provider what activities are safe for you.  Keep all follow-up visits as told by your health care provider. This is important. Contact a health care provider if:  You feel dizzy.  You develop new symptoms.  You vomit or feel  nauseous.  You have a fever. Get help right away if:  You develop sudden changes in your vision.  You have severe ear pain.  You have new or increased weakness.  You have a severe headache. This information is not intended to replace advice given to you by your health care provider. Make sure you discuss any questions you have with your health care provider. Document Released: 08/18/2005 Document Revised: 01/24/2016 Document Reviewed: 01/03/2015 Elsevier Interactive Patient Education  2017 [employer].

## 2016-12-09 NOTE — Patient Instructions (Signed)
It was nice seeing you today. You look great. I will like to see you again in Aug.  Pneumococcal Vaccine, Polyvalent solution for injection What is this medicine? PNEUMOCOCCAL VACCINE, POLYVALENT (NEU mo KOK al vak SEEN, pol ee VEY luhnt) is a vaccine to prevent pneumococcus bacteria infection. These bacteria are a major cause of ear infections, Strep throat infections, and serious pneumonia, meningitis, or blood infections worldwide. These vaccines help the body to produce antibodies (protective substances) that help your body defend against these bacteria. This vaccine is recommended for people 81 years of age and older with health problems. It is also recommended for all adults over 53 years old. This vaccine will not treat an infection. This medicine may be used for other purposes; ask your health care provider or pharmacist if you have questions. COMMON BRAND NAME(S): Pneumovax 23 What should I tell my health care provider before I take this medicine? They need to know if you have any of these conditions: -bleeding problems -bone marrow or organ transplant -cancer, Hodgkin's disease -fever -infection -immune system problems -low platelet count in the blood -seizures -an unusual or allergic reaction to pneumococcal vaccine, diphtheria toxoid, other vaccines, latex, other medicines, foods, dyes, or preservatives -pregnant or trying to get pregnant -breast-feeding How should I use this medicine? This vaccine is for injection into a muscle or under the skin. It is given by a health care professional. A copy of Vaccine Information Statements will be given before each vaccination. Read this sheet carefully each time. The sheet may change frequently. Talk to your pediatrician regarding the use of this medicine in children. While this drug may be prescribed for children as young as 36 years of age for selected conditions, precautions do apply. Overdosage: If you think you have taken too much of  this medicine contact a poison control center or emergency room at once. NOTE: This medicine is only for you. Do not share this medicine with others. What if I miss a dose? It is important not to miss your dose. Call your doctor or health care professional if you are unable to keep an appointment. What may interact with this medicine? -medicines for cancer chemotherapy -medicines that suppress your immune function -medicines that treat or prevent blood clots like warfarin, enoxaparin, and dalteparin -steroid medicines like prednisone or cortisone This list may not describe all possible interactions. Give your health care provider a list of all the medicines, herbs, non-prescription drugs, or dietary supplements you use. Also tell them if you smoke, drink alcohol, or use illegal drugs. Some items may interact with your medicine. What should I watch for while using this medicine? Mild fever and pain should go away in 3 days or less. Report any unusual symptoms to your doctor or health care professional. What side effects may I notice from receiving this medicine? Side effects that you should report to your doctor or health care professional as soon as possible: -allergic reactions like skin rash, itching or hives, swelling of the face, lips, or tongue -breathing problems -confused -fever over 102 degrees F -pain, tingling, numbness in the hands or feet -seizures -unusual bleeding or bruising -unusual muscle weakness Side effects that usually do not require medical attention (report to your doctor or health care professional if they continue or are bothersome): -aches and pains -diarrhea -fever of 102 degrees F or less -headache -irritable -loss of appetite -pain, tender at site where injected -trouble sleeping This list may not describe all possible side effects. Call  your doctor for medical advice about side effects. You may report side effects to FDA at 1-800-FDA-1088. Where should I  keep my medicine? This does not apply. This vaccine is given in a clinic, pharmacy, doctor's office, or other health care setting and will not be stored at home. NOTE: This sheet is a summary. It may not cover all possible information. If you have questions about this medicine, talk to your doctor, pharmacist, or health care provider.  2018 Elsevier/Gold Standard (2008-03-24 14:32:37)

## 2016-12-09 NOTE — Progress Notes (Signed)
Patient ID: Brian Manning, male   DOB: [birth year], 83 y.o.   MRN: [id number] [facility] ATTENDING NOTE Pharell Rolfson,MD I  have seen and examined this patient, reviewed their chart. I have discussed this patient with Lauren. I agree with her findings, assessment and care plan.  Patient will benefit from hearing aid. I will follow-up on this if he is interested in getting one.

## 2016-12-24 ENCOUNTER — Telehealth: Payer: Self-pay | Admitting: Family Medicine

## 2016-12-24 NOTE — Telephone Encounter (Signed)
I called to check on patient and review his wellness exam with him. He did not pass his hearing screening test. He said he is aware of this and has been dealing with hearing loss for a while. I recommended referral to ENT/Audiology for hearing aid fixing but he declined. Potential implication of hearing loss discussed with him. He verbalized understanding.  I wished him a happy birthday today. He was excited about that.

## 2017-06-22 ENCOUNTER — Other Ambulatory Visit: Payer: Self-pay | Admitting: Family Medicine

## 2017-06-26 ENCOUNTER — Telehealth: Payer: Self-pay | Admitting: *Deleted

## 2017-06-26 NOTE — Telephone Encounter (Signed)
Patient called in and spoke with Practice Director. States [employer] has not received refill for pravastatin. Called [employer] at [phone] states they did receive e-script but there was a glitch and didn't go through. VO given for pravastatin. Patient notified. Hubbard Hartshorn, RN, BSN

## 2017-12-01 ENCOUNTER — Encounter: Payer: Self-pay | Admitting: Internal Medicine

## 2017-12-01 ENCOUNTER — Other Ambulatory Visit: Payer: Self-pay

## 2017-12-01 ENCOUNTER — Ambulatory Visit (INDEPENDENT_AMBULATORY_CARE_PROVIDER_SITE_OTHER): Payer: Medicare HMO | Admitting: Internal Medicine

## 2017-12-01 VITALS — BP 132/62 | HR 78 | Temp 97.5°F | Wt 176.0 lb

## 2017-12-01 DIAGNOSIS — E785 Hyperlipidemia, unspecified: Secondary | ICD-10-CM | POA: Diagnosis not present

## 2017-12-01 DIAGNOSIS — Z7901 Long term (current) use of anticoagulants: Secondary | ICD-10-CM | POA: Diagnosis not present

## 2017-12-01 DIAGNOSIS — R739 Hyperglycemia, unspecified: Secondary | ICD-10-CM

## 2017-12-01 DIAGNOSIS — Z0000 Encounter for general adult medical examination without abnormal findings: Secondary | ICD-10-CM

## 2017-12-01 DIAGNOSIS — I25810 Atherosclerosis of coronary artery bypass graft(s) without angina pectoris: Secondary | ICD-10-CM

## 2017-12-01 LAB — POCT GLYCOSYLATED HEMOGLOBIN (HGB A1C): Hemoglobin A1C: 5.5

## 2017-12-01 NOTE — Patient Instructions (Signed)
It was so nice to meet you!  I have ordered some labs today (a complete blood count and a kidney/electrolyte panel) to make sure it is still safe for you to be taking the Eliquis.  I have also ordered a cholesterol panel for you.  I will call you with your lab results.  -Dr. Brett Albino

## 2017-12-01 NOTE — Assessment & Plan Note (Signed)
Glucose on last BMP from 2017 was 117. Last A1c checked in 2016 was 5.7%. - Check A1c

## 2017-12-01 NOTE — Progress Notes (Signed)
84 y.o. year old male presents for well adult exam.  Acute Concerns: none  Diet: has been cutting back on portions. Eats smoothies twice daily (consisting of grains, cottage cheese, apples, carrots, celery, splenda, flax seed, milk, prunes, cocoa powder). Also eats homemade soups, salads, [place], chicken, vegetable juice  Exercise: Exercises daily. Goes to the [facility]. Walks and does weight machines.  Social:  Social History   Socioeconomic History  . Marital status: Divorced    Spouse name: Not on file  . Number of children: Not on file  . Years of education: Not on file  . Highest education level: Not on file  Occupational History  . Occupation: retired  Scientific laboratory technician  . Financial resource strain: Not on file  . Food insecurity:    Worry: Not on file    Inability: Not on file  . Transportation needs:    Medical: Not on file    Non-medical: Not on file  Tobacco Use  . Smoking status: Never Smoker  . Smokeless tobacco: Never Used  Substance and Sexual Activity  . Alcohol use: Yes    Alcohol/week: 0.6 oz    Types: 1 Shots of liquor per week    Comment: Less than 1 drink per week  . Drug use: No  . Sexual activity: Never  Lifestyle  . Physical activity:    Days per week: Not on file    Minutes per session: Not on file  . Stress: Not on file  Relationships  . Social connections:    Talks on phone: Not on file    Gets together: Not on file    Attends religious service: Not on file    Active member of club or organization: Not on file    Attends meetings of clubs or organizations: Not on file    Relationship status: Not on file  Other Topics Concern  . Not on file  Social History Narrative   Retired Engineer, maintenance (IT).  Divorced- ex wife now passed away.  Lives alone- has two children in [place].  College graduate.    Immunization: Immunization History  Administered Date(s) Administered  . Influenza,inj,Quad PF,6+ Mos 06/13/2014  . Influenza-Unspecified 06/01/2013,  06/02/2015, 06/01/2016  . Pneumococcal Conjugate-13 05/05/2014  . Pneumococcal Polysaccharide-23 12/09/2016    Cancer Screening:  Colonoscopy: not indicated due to age  Physical Exam: VITALS: Reviewed GEN: Pleasant male, NAD HEENT: Normocephalic, PERRL, EOMI, no scleral icterus, bilateral TM pearly grey, nasal septum midline, MMM, uvula midline, no anterior or posterior lymphadenopathy, no thyromegaly CARDIAC:RRR, S1 and S2 present, no murmur, no heaves/thrills RESP: CTAB, normal effort ABD: soft, no tenderness, normal bowel sounds EXT: No edema, 2+ radial and DP pulses SKIN: no rash  ASSESSMENT & PLAN: 84 y.o. male presents for annual physical. Please see problem specific assessment and plan.   Chronic Anticoagulation: On Eliquis for paroxysmal A-fib. Has not had CBC and BMET since 2017. Has not noticed any bleeding. - Check CBC to ensure Hgb stable - Check BMP to make sure kidney function is stable - Continue Eliquis  Hyperglycemia: Glucose on last BMP from 2017 was 117. Last A1c checked in 2016 was 5.7%. - Check A1c  Follow-up in 1 year or earlier if needed.

## 2017-12-01 NOTE — Assessment & Plan Note (Signed)
On Eliquis for paroxysmal A-fib. Has not had CBC and BMET since 2017. Has not noticed any bleeding. - Check CBC to ensure Hgb stable - Check BMP to make sure kidney function is stable - Continue Eliquis

## 2017-12-02 ENCOUNTER — Encounter: Payer: Self-pay | Admitting: *Deleted

## 2017-12-02 ENCOUNTER — Telehealth: Payer: Self-pay | Admitting: Internal Medicine

## 2017-12-02 LAB — BASIC METABOLIC PANEL
BUN / CREAT RATIO: 19 (ref 10–24)
BUN: 16 mg/dL (ref 8–27)
CHLORIDE: 99 mmol/L (ref 96–106)
CO2: 26 mmol/L (ref 20–29)
Calcium: 9.2 mg/dL (ref 8.6–10.2)
Creatinine, Ser: 0.84 mg/dL (ref 0.76–1.27)
GFR calc non Af Amer: 79 mL/min/{1.73_m2} (ref >59)
GFR, EST AFRICAN AMERICAN: 91 mL/min/{1.73_m2} (ref >59)
GLUCOSE: 117 mg/dL — AB (ref 65–99)
POTASSIUM: 4.1 mmol/L (ref 3.5–5.2)
SODIUM: 140 mmol/L (ref 134–144)

## 2017-12-02 LAB — LIPID PANEL
CHOLESTEROL TOTAL: 112 mg/dL (ref 100–199)
Chol/HDL Ratio: 2.5 ratio (ref 0.0–5.0)
HDL: 45 mg/dL (ref >39)
LDL Calculated: 57 mg/dL (ref 0–99)
Triglycerides: 52 mg/dL (ref 0–149)
VLDL Cholesterol Cal: 10 mg/dL (ref 5–40)

## 2017-12-02 LAB — CBC
HEMATOCRIT: 42.9 % (ref 37.5–51.0)
HEMOGLOBIN: 14.4 g/dL (ref 13.0–17.7)
MCH: 31.6 pg (ref 26.6–33.0)
MCHC: 33.6 g/dL (ref 31.5–35.7)
MCV: 94 fL (ref 79–97)
Platelets: 243 10*3/uL (ref 150–379)
RBC: 4.56 x10E6/uL (ref 4.14–5.80)
RDW: 13.8 % (ref 12.3–15.4)
WBC: 3.8 10*3/uL (ref 3.4–10.8)

## 2017-12-02 NOTE — Telephone Encounter (Signed)
Called patient to discuss his lab results. CBC, BMP, lipid panel were all normal. Patient voiced understanding. All questions answered.  Hyman Bible, MD PGY-3

## 2018-01-25 ENCOUNTER — Other Ambulatory Visit: Payer: Self-pay | Admitting: Family Medicine

## 2018-02-08 ENCOUNTER — Ambulatory Visit: Payer: Self-pay

## 2019-01-14 ENCOUNTER — Ambulatory Visit: Payer: Medicare HMO | Admitting: Family Medicine

## 2019-02-17 ENCOUNTER — Other Ambulatory Visit: Payer: Self-pay | Admitting: Family Medicine

## 2019-02-25 ENCOUNTER — Telehealth (INDEPENDENT_AMBULATORY_CARE_PROVIDER_SITE_OTHER): Payer: Medicare HMO | Admitting: Family Medicine

## 2019-02-25 ENCOUNTER — Encounter: Payer: Self-pay | Admitting: Family Medicine

## 2019-02-25 ENCOUNTER — Other Ambulatory Visit: Payer: Self-pay

## 2019-02-25 DIAGNOSIS — I480 Paroxysmal atrial fibrillation: Secondary | ICD-10-CM | POA: Diagnosis not present

## 2019-02-25 DIAGNOSIS — E785 Hyperlipidemia, unspecified: Secondary | ICD-10-CM

## 2019-02-25 NOTE — Assessment & Plan Note (Signed)
Stable on Eliquis. I offered lab appointment for Bmet but he stated that he will get it done at his next appointment at the [facility] in Nov. F/U as needed.

## 2019-02-25 NOTE — Progress Notes (Signed)
WT- 180LB BP- 126/64 BPM- 71 T- No Fever [facility] [address]  Pt gave consent to telephone visit Salvatore Marvel, CMA

## 2019-02-25 NOTE — Progress Notes (Signed)
  Subjective:   [facility] Telemedicine Visit  Patient consented to have virtual visit. Method of visit: Telephone  Encounter participants: Patient: CAEDYN RAYGOZA - located at Home Provider: Andrena Mews - located at Office Others (if applicable): NA      Patient ID: BRANTLEY WILEY, male   DOB: [birth year], 85 y.o.   MRN: [id number]  HPI Atrial Fib: He is compliant with his Eliquis. He denies medication side effects. He has not been back to [facility] since 2017. HLD: On Pravachol 40 mg qd and fish oil. No concern.   Review of Systems  Constitutional: Negative.   Respiratory: Negative.   Cardiovascular: Negative.   Gastrointestinal: Negative.   Musculoskeletal: Negative.   Neurological: Negative.   All other systems reviewed and are negative.      Objective:   Physical Exam Vitals signs and nursing note reviewed.  Pulmonary:     Comments: He could complete full sentences over the telephone.       Assessment:     Atrial fibrillation HLD    Plan:     Check problem list.    Total time spent for this visit: 15 min

## 2019-02-25 NOTE — Assessment & Plan Note (Signed)
Stable on meds. Lab appointment for FLP offered. He prefers to get this checked in Nov at the [place]. He will have them fax [place] his result.

## 2019-02-25 NOTE — Progress Notes (Signed)
Attempted to call twice. Seems his phone is tuned off.  I will call back later.

## 2019-04-19 ENCOUNTER — Encounter: Payer: Self-pay | Admitting: Family Medicine

## 2019-04-19 ENCOUNTER — Other Ambulatory Visit: Payer: Self-pay

## 2019-04-19 ENCOUNTER — Ambulatory Visit (INDEPENDENT_AMBULATORY_CARE_PROVIDER_SITE_OTHER): Payer: Medicare HMO | Admitting: Family Medicine

## 2019-04-19 VITALS — BP 125/70 | HR 88 | Temp 98.4°F | Wt 172.0 lb

## 2019-04-19 DIAGNOSIS — I10 Essential (primary) hypertension: Secondary | ICD-10-CM | POA: Diagnosis not present

## 2019-04-19 DIAGNOSIS — R2689 Other abnormalities of gait and mobility: Secondary | ICD-10-CM | POA: Diagnosis not present

## 2019-04-19 DIAGNOSIS — N401 Enlarged prostate with lower urinary tract symptoms: Secondary | ICD-10-CM | POA: Diagnosis not present

## 2019-04-19 DIAGNOSIS — I480 Paroxysmal atrial fibrillation: Secondary | ICD-10-CM | POA: Diagnosis not present

## 2019-04-19 DIAGNOSIS — I25810 Atherosclerosis of coronary artery bypass graft(s) without angina pectoris: Secondary | ICD-10-CM

## 2019-04-19 DIAGNOSIS — E785 Hyperlipidemia, unspecified: Secondary | ICD-10-CM | POA: Diagnosis not present

## 2019-04-19 NOTE — Progress Notes (Signed)
Subjective:     Patient ID: Brian Manning, male   DOB: [birth year], 85 y.o.   MRN: [id number]  Urinary Frequency  This is a chronic (BPH getting worse) problem. The current episode started more than 1 year ago. The problem occurs every urination. The problem has been gradually worsening. Quality: No pain. The patient is experiencing no pain. There has been no fever. Associated symptoms include frequency, hesitancy and urgency. Pertinent negatives include no nausea. Associated symptoms comments: +Nocturia, he gets up more than 4 times every night to urinate and he has symptoms of incomplete bladder emptying.. Treatments tried: He was started on Urixatral which gave him severe hypotension, hence he d/ced the medication. The treatment provided no relief. There is no history of recurrent UTIs.  He was seen at [facility] urologist in Feb by Dr. Gloriann Loan, however, he was not satisfied with the management provided. He will like to switch his provider to Dr. Paticia Stack. HTN/HLD:Off BP meds. He is compliant with his Pravachol at 40 mg qd. Here for f/u. CAD/Afib:Denies cardiac symptoms. He is compliant with his Eliquis 5 mg BID. Denies bleeding or bruising. Gait:Patient stated that he is aging. He denies risk for fall. He had conversation with his family regarding his health care and he stated that he does not wish to be in a situation where he is unable to think or make his own choices. He requested a letter to get exercise at the [facility] for strength.   Current Outpatient Medications on File Prior to Visit  Medication Sig Dispense Refill  . alfuzosin (UROXATRAL) 10 MG 24 hr tablet Take 10 mg by mouth daily with breakfast.    . apixaban (ELIQUIS) 5 MG TABS tablet Take 1 tablet (5 mg total) by mouth 2 (two) times daily. 60 tablet 11  . ENSURE PLUS (ENSURE PLUS) LIQD Take 1 Can by mouth.    . fish oil-omega-3 fatty acids 1000 MG capsule Take 1 g by mouth daily.     . folic acid (FOLVITE) 269 MCG tablet Take 400 mcg  by mouth daily.      Marland Kitchen lactulose (CHRONULAC) 10 GM/15ML solution Take 15 mLs (10 g total) by mouth daily as needed for mild constipation. (Patient not taking: Reported on 12/09/2016) 120 mL 0  . Multiple Vitamin (MULTIVITAMIN) tablet Take 1 tablet by mouth daily.      . pravastatin (PRAVACHOL) 40 MG tablet TAKE 1 TABLET EVERY DAY 90 tablet 0   No current facility-administered medications on file prior to visit.    Past Medical History:  Diagnosis Date  . Coronary artery disease   . Hyperlipidemia   . Hypertension      Review of Systems  Respiratory: Negative.   Cardiovascular: Negative.   Gastrointestinal: Negative for nausea.  Genitourinary: Positive for frequency, hesitancy and urgency. Negative for dysuria.  Musculoskeletal: Negative.   Neurological: Negative.   All other systems reviewed and are negative.      Objective:   Physical Exam Vitals signs and nursing note reviewed.  Cardiovascular:     Rate and Rhythm: Normal rate and regular rhythm.     Pulses: Normal pulses.     Heart sounds: Normal heart sounds. No murmur.  Pulmonary:     Effort: Pulmonary effort is normal. No respiratory distress.     Breath sounds: Normal breath sounds. No wheezing or rhonchi.  Abdominal:     General: Abdomen is flat. Bowel sounds are normal. There is no distension.     Tenderness:  There is no abdominal tenderness.  Genitourinary:    Prostate: Enlarged. Not tender and no nodules present.     Rectum: No mass, tenderness or anal fissure. Normal anal tone.     Comments: Chaperone: Page Nicki Reaper Musculoskeletal:     Right lower leg: No edema.     Left lower leg: No edema.     Comments: Slow but steady gait  Neurological:     Mental Status: He is alert.        Assessment:     BPH HTN HLD CAD Afib Cautious gait Health maintenance    Plan:     Check problem list.  More than 50% of this 60 min face to face appointment was spend on counseling and coordination of care.  For  health maintenance, he will return soon for flu vaccine. Counseling done.

## 2019-04-19 NOTE — Patient Instructions (Signed)

## 2019-04-19 NOTE — Assessment & Plan Note (Signed)
Compliant with Eliquis. He denies fall risk. Monitor closely on Eliquis.

## 2019-04-19 NOTE — Assessment & Plan Note (Signed)
Compliant with meds. FLP and Cmet checked today.

## 2019-04-19 NOTE — Assessment & Plan Note (Signed)
Rx given to get low tolerance exercise at [facility].

## 2019-04-19 NOTE — Assessment & Plan Note (Signed)
Off meds for now. Continue to d/c Uroxatral which can cause hypotension. He came with his medication and I gave them to Page to dispose of it. Monitor BP closely at home.

## 2019-04-19 NOTE — Assessment & Plan Note (Signed)
Asymptomatic. Not on ASA. On Eliquis. Monitor closely.

## 2019-04-19 NOTE — Assessment & Plan Note (Signed)
He insisted on getting rectal exam done to confirm BPH. He does indeed have an  Enlarged prostate. I called [facility] to check on his referral and I was informed he does not need a new one. He wanted to switch from Dr. Gloriann Loan to Dr. Larinda Buttery. However, Lar[facility] Buttery does not take new patient per the person I spoke with. They have 10 other providers taking new patients. I called him after he already left and advised him to call [facility]rologist to cancel his future appointment with Dr. Gloriann Loan and reschedule with a different provider. He said he will do so tomorrow.

## 2019-04-20 ENCOUNTER — Telehealth: Payer: Self-pay | Admitting: Family Medicine

## 2019-04-20 LAB — CMP14+EGFR
ALT: 9 IU/L (ref 0–44)
AST: 13 IU/L (ref 0–40)
Albumin/Globulin Ratio: 2.2 (ref 1.2–2.2)
Albumin: 4.6 g/dL (ref 3.6–4.6)
Alkaline Phosphatase: 67 IU/L (ref 39–117)
BUN/Creatinine Ratio: 20 (ref 10–24)
BUN: 19 mg/dL (ref 8–27)
Bilirubin Total: 0.6 mg/dL (ref 0.0–1.2)
CO2: 24 mmol/L (ref 20–29)
Calcium: 9.4 mg/dL (ref 8.6–10.2)
Chloride: 98 mmol/L (ref 96–106)
Creatinine, Ser: 0.94 mg/dL (ref 0.76–1.27)
GFR calc Af Amer: 83 mL/min/{1.73_m2} (ref >59)
GFR calc non Af Amer: 72 mL/min/{1.73_m2} (ref >59)
Globulin, Total: 2.1 g/dL (ref 1.5–4.5)
Glucose: 123 mg/dL — ABNORMAL HIGH (ref 65–99)
Potassium: 4.3 mmol/L (ref 3.5–5.2)
Sodium: 136 mmol/L (ref 134–144)
Total Protein: 6.7 g/dL (ref 6.0–8.5)

## 2019-04-20 LAB — LIPID PANEL
Chol/HDL Ratio: 2.3 ratio (ref 0.0–5.0)
Cholesterol, Total: 113 mg/dL (ref 100–199)
HDL: 50 mg/dL (ref >39)
LDL Calculated: 52 mg/dL (ref 0–99)
Triglycerides: 55 mg/dL (ref 0–149)
VLDL Cholesterol Cal: 11 mg/dL (ref 5–40)

## 2019-04-20 MED ORDER — PRAVASTATIN SODIUM 20 MG PO TABS: 20.0000 mg | ORAL_TABLET | Freq: Every day | ORAL | 2 refills | Status: DC

## 2019-04-20 NOTE — Telephone Encounter (Signed)
I discussed all results with him. May cut back on his Pravachol to 20 mg qd from 40 mg qd. D/C fish oil. He takes fish oil 1gm daily, however, his Triglyceride is in the low normal range. Recheck FLP in a year. He agreed with the plan.

## 2019-05-12 ENCOUNTER — Other Ambulatory Visit: Payer: Self-pay

## 2019-05-12 ENCOUNTER — Ambulatory Visit (INDEPENDENT_AMBULATORY_CARE_PROVIDER_SITE_OTHER): Payer: Medicare HMO

## 2019-05-12 DIAGNOSIS — Z23 Encounter for immunization: Secondary | ICD-10-CM | POA: Diagnosis not present

## 2019-05-17 ENCOUNTER — Other Ambulatory Visit: Payer: Self-pay | Admitting: Urology

## 2019-05-18 ENCOUNTER — Other Ambulatory Visit: Payer: Self-pay | Admitting: Family Medicine

## 2019-05-18 ENCOUNTER — Telehealth: Payer: Self-pay | Admitting: Family Medicine

## 2019-05-18 DIAGNOSIS — I4891 Unspecified atrial fibrillation: Secondary | ICD-10-CM

## 2019-05-18 NOTE — Telephone Encounter (Signed)
I received surgical clearance request for urologic procedure scheduled for Oct 7th.  I called and discussed with the patient. He will come in next Friday for lab work and pre-surg eval.  All questions were answered.

## 2019-05-19 ENCOUNTER — Telehealth: Payer: Self-pay | Admitting: Family Medicine

## 2019-05-19 DIAGNOSIS — I480 Paroxysmal atrial fibrillation: Secondary | ICD-10-CM

## 2019-05-19 NOTE — Telephone Encounter (Signed)
Hx of PAF.  Scheduled for TURP on Oct 7th.  Unclear if he will need stress test with hx of PAF.  He had not been back to [facility] since 2018.  I will message his cardiologist regarding this.

## 2019-05-19 NOTE — Telephone Encounter (Signed)
Dr. Gwendlyn Deutscher,  I would say that he can proceed with TURP from cardiac perspective with no stress test. This is a fairly low risk procedure. He is not having any chest pain or other high risk symptoms.  Hold his Eliquis for 3 days prior to procedure.   Thanks  Candee Furbish, MD

## 2019-05-19 NOTE — Telephone Encounter (Signed)
Thanks a lot for your prompt response

## 2019-05-19 NOTE — Addendum Note (Signed)
Addended by: Andrena Mews T on: 05/19/2019 05:20 PM   Modules accepted: Orders

## 2019-05-20 ENCOUNTER — Other Ambulatory Visit: Payer: Self-pay

## 2019-05-20 ENCOUNTER — Ambulatory Visit (HOSPITAL_COMMUNITY)
Admission: RE | Admit: 2019-05-20 | Discharge: 2019-05-20 | Disposition: A | Discharge status: Home / Self Care | Payer: No Typology Code available for payment source | Source: Ambulatory Visit | Attending: Family Medicine | Admitting: Family Medicine

## 2019-05-20 ENCOUNTER — Ambulatory Visit (INDEPENDENT_AMBULATORY_CARE_PROVIDER_SITE_OTHER): Payer: Medicare HMO | Admitting: Family Medicine

## 2019-05-20 ENCOUNTER — Encounter: Payer: Self-pay | Admitting: Family Medicine

## 2019-05-20 ENCOUNTER — Telehealth: Payer: Self-pay

## 2019-05-20 VITALS — BP 122/62 | HR 73 | Ht 71.0 in | Wt 171.1 lb

## 2019-05-20 DIAGNOSIS — I480 Paroxysmal atrial fibrillation: Secondary | ICD-10-CM

## 2019-05-20 DIAGNOSIS — N401 Enlarged prostate with lower urinary tract symptoms: Secondary | ICD-10-CM | POA: Diagnosis not present

## 2019-05-20 NOTE — Telephone Encounter (Signed)
Spoke with pt. Informed him that his ECHO was schedule for Oct. 2nd, 2020 at 9:00a. Pt has to go to admissions at [facility] and let them know that he is there for an ECHO. Pt understood. He repeated information back to me. Salvatore Marvel, CMA

## 2019-05-20 NOTE — Progress Notes (Signed)
  Subjective:     Patient ID: Brian Manning, male   DOB: 11/16/1927, 85 y.o.   MRN: [id number]  HPI BPH: GU symptoms remains same. He is scheduled for TURP on Oct 7th. He is here for surgical clearance. He denies any concerns today. Afib: On Eliquis 5 mg BID.  Current Outpatient Medications on File Prior to Visit  Medication Sig Dispense Refill  . apixaban (ELIQUIS) 5 MG TABS tablet Take 1 tablet (5 mg total) by mouth 2 (two) times daily. 60 tablet 11  . ENSURE PLUS (ENSURE PLUS) LIQD Take 1 Can by mouth.    . folic acid (FOLVITE) Q000111Q MCG tablet Take 400 mcg by mouth daily.      . Multiple Vitamin (MULTIVITAMIN) tablet Take 1 tablet by mouth daily.      . pravastatin (PRAVACHOL) 20 MG tablet Take 1 tablet (20 mg total) by mouth at bedtime. 90 tablet 2  . lactulose (CHRONULAC) 10 GM/15ML solution Take 15 mLs (10 g total) by mouth daily as needed for mild constipation. (Patient not taking: Reported on 12/09/2016) 120 mL 0   No current facility-administered medications on file prior to visit.    Past Medical History:  Diagnosis Date  . Coronary artery disease   . Hyperlipidemia   . Hypertension    Vitals:   05/20/19 1028  BP: 122/62  Pulse: 73  Weight: 171 lb 2 oz (77.6 kg)  Height: 5\' 11"  (1.803 m)     Review of Systems  Respiratory: Negative.   Cardiovascular: Negative.   Gastrointestinal: Negative.   Genitourinary: Negative for dysuria.       BPH symptoms.  All other systems reviewed and are negative.      Objective:   Physical Exam Vitals signs and nursing note reviewed.  Cardiovascular:     Rate and Rhythm: Normal rate and regular rhythm.     Heart sounds: Normal heart sounds. No murmur.  Pulmonary:     Effort: Pulmonary effort is normal. No respiratory distress.     Breath sounds: Normal breath sounds. No stridor. No wheezing or rhonchi.  Abdominal:     General: Abdomen is flat. Bowel sounds are normal. There is no distension.     Palpations: Abdomen is soft.  There is no mass.  Musculoskeletal: Normal range of motion.  Neurological:     General: No focal deficit present.     Mental Status: He is alert and oriented to person, place, and time.        Assessment:     BPH    Plan:     Check problem list.

## 2019-05-20 NOTE — Patient Instructions (Signed)
It was nice seeing you. Your EKG looks good. Let us get some blood test and [place] of your heart. After that we will clear you for surgery. Hold Eliquis for 2-3 days before surgery. You typical can resume after 24 hours post surgery. However, follow your urologist instruction regarding when to start, not more than 48 hours after procedure.

## 2019-05-20 NOTE — Assessment & Plan Note (Signed)
Stable and now sinus. Hold Eliquis 2-3 days prior to surgery. May resume at least 24 hours post-op.

## 2019-05-20 NOTE — Assessment & Plan Note (Signed)
Scheduled for TURP on Oct 7th. Need Clearance for Surgery. I contacted his cardiologist and the do not recommend stress test for him to proceed. EKG done today: NSR at 66 BMP with isolated t-wave inversion on aVL which is almost similar to the previous EKG. Obtain ECHO. Check CBC and INR. I will clear him once I have all results.

## 2019-05-21 LAB — CBC WITH DIFFERENTIAL/PLATELET
Basophils Absolute: 0 10*3/uL (ref 0.0–0.2)
Basos: 1 %
EOS (ABSOLUTE): 0.1 10*3/uL (ref 0.0–0.4)
Eos: 2 %
Hematocrit: 44.4 % (ref 37.5–51.0)
Hemoglobin: 14.8 g/dL (ref 13.0–17.7)
Immature Grans (Abs): 0 10*3/uL (ref 0.0–0.1)
Immature Granulocytes: 0 %
Lymphocytes Absolute: 1.2 10*3/uL (ref 0.7–3.1)
Lymphs: 28 %
MCH: 31.6 pg (ref 26.6–33.0)
MCHC: 33.3 g/dL (ref 31.5–35.7)
MCV: 95 fL (ref 79–97)
Monocytes Absolute: 0.5 10*3/uL (ref 0.1–0.9)
Monocytes: 12 %
Neutrophils Absolute: 2.6 10*3/uL (ref 1.4–7.0)
Neutrophils: 57 %
Platelets: 207 10*3/uL (ref 150–450)
RBC: 4.69 x10E6/uL (ref 4.14–5.80)
RDW: 12.3 % (ref 11.6–15.4)
WBC: 4.4 10*3/uL (ref 3.4–10.8)

## 2019-05-21 LAB — PROTIME-INR
INR: 1 (ref 0.8–1.2)
Prothrombin Time: 11.1 s (ref 9.1–12.0)

## 2019-05-27 ENCOUNTER — Other Ambulatory Visit: Payer: Medicare HMO

## 2019-05-31 ENCOUNTER — Ambulatory Visit: Payer: Medicare HMO | Admitting: Family Medicine

## 2019-06-02 ENCOUNTER — Encounter (HOSPITAL_COMMUNITY): Payer: Self-pay

## 2019-06-03 ENCOUNTER — Encounter (HOSPITAL_COMMUNITY)
Admission: RE | Admit: 2019-06-03 | Discharge: 2019-06-03 | Disposition: A | Discharge status: Home / Self Care | Payer: No Typology Code available for payment source | Source: Ambulatory Visit | Attending: Family Medicine | Admitting: Family Medicine

## 2019-06-03 ENCOUNTER — Other Ambulatory Visit: Payer: Self-pay

## 2019-06-03 ENCOUNTER — Encounter (HOSPITAL_COMMUNITY): Payer: Self-pay

## 2019-06-03 ENCOUNTER — Ambulatory Visit (HOSPITAL_COMMUNITY)
Admission: RE | Admit: 2019-06-03 | Discharge: 2019-06-03 | Disposition: A | Discharge status: Home / Self Care | Payer: No Typology Code available for payment source | Source: Ambulatory Visit | Attending: Family Medicine | Admitting: Family Medicine

## 2019-06-03 ENCOUNTER — Encounter: Payer: Self-pay | Admitting: Family Medicine

## 2019-06-03 DIAGNOSIS — N320 Bladder-neck obstruction: Secondary | ICD-10-CM | POA: Diagnosis not present

## 2019-06-03 DIAGNOSIS — Z951 Presence of aortocoronary bypass graft: Secondary | ICD-10-CM | POA: Insufficient documentation

## 2019-06-03 DIAGNOSIS — I4891 Unspecified atrial fibrillation: Secondary | ICD-10-CM | POA: Insufficient documentation

## 2019-06-03 DIAGNOSIS — Z01818 Encounter for other preprocedural examination: Secondary | ICD-10-CM | POA: Diagnosis not present

## 2019-06-03 DIAGNOSIS — E785 Hyperlipidemia, unspecified: Secondary | ICD-10-CM | POA: Insufficient documentation

## 2019-06-03 DIAGNOSIS — I480 Paroxysmal atrial fibrillation: Secondary | ICD-10-CM

## 2019-06-03 DIAGNOSIS — I358 Other nonrheumatic aortic valve disorders: Secondary | ICD-10-CM | POA: Insufficient documentation

## 2019-06-03 DIAGNOSIS — I10 Essential (primary) hypertension: Secondary | ICD-10-CM | POA: Diagnosis not present

## 2019-06-03 HISTORY — DX: Prediabetes: R73.03

## 2019-06-03 HISTORY — DX: Pneumonia, unspecified organism: J18.9

## 2019-06-03 HISTORY — DX: Bladder-neck obstruction: N32.0

## 2019-06-03 HISTORY — DX: Unspecified hearing loss, bilateral: H91.93

## 2019-06-03 HISTORY — DX: Unspecified atrial fibrillation: I48.91

## 2019-06-03 HISTORY — DX: Benign prostatic hyperplasia without lower urinary tract symptoms: N40.0

## 2019-06-03 LAB — CBC
HCT: 45.9 % (ref 39.0–52.0)
Hemoglobin: 15.1 g/dL (ref 13.0–17.0)
MCH: 32.8 pg (ref 26.0–34.0)
MCHC: 32.9 g/dL (ref 30.0–36.0)
MCV: 99.6 fL (ref 80.0–100.0)
Platelets: 189 10*3/uL (ref 150–400)
RBC: 4.61 MIL/uL (ref 4.22–5.81)
RDW: 12.7 % (ref 11.5–15.5)
WBC: 5 10*3/uL (ref 4.0–10.5)
nRBC: 0 % (ref 0.0–0.2)

## 2019-06-03 LAB — HEMOGLOBIN A1C
Hgb A1c MFr Bld: 5.6 % (ref 4.8–5.6)
Mean Plasma Glucose: 114.02 mg/dL

## 2019-06-03 NOTE — Progress Notes (Signed)
Echocardiogram 2D Echocardiogram has been performed.  Oneal Deputy Brian Manning 06/03/2019, 9:28 AM

## 2019-06-03 NOTE — Progress Notes (Signed)
Patient ID: Brian Manning, male   DOB: 03/19/1928, 85 y.o.   MRN: [id number] Copy of medical clearance letter, placed in the front office to be faxed to his urologist.

## 2019-06-03 NOTE — Patient Instructions (Signed)
DUE TO COVID-19 ONLY ONE VISITOR IS ALLOWED TO COME WITH YOU AND STAY IN THE WAITING ROOM ONLY DURING PRE OP AND PROCEDURE. THE ONE VISITOR MAY VISIT WITH YOU IN YOUR PRIVATE ROOM DURING VISITING HOURS ONLY!!   COVID SWAB TESTING MUST BE COMPLETED ON: Tomorrow, Oct. 3, 2020 at 9:50AM.     [address], [place] [facility] enter pre surgical testing line (Must self quarantine after testing. Follow instructions on handout.)             Your procedure is scheduled on: Wednesday, Oct. 7, 2020   Report to [facility] Main  Entrance    Report to admitting at 6:30 AM   Call this number if you have problems the morning of surgery [phone]   Do not eat food or drink liquids :After Midnight.   Brush your teeth the morning of surgery.   Do NOT smoke after Midnight   Take these medicines the morning of surgery with A SIP OF WATER: None                               You may not have any metal on your body including jewelry, and body piercings             Do not wear lotions, powders, perfumes/cologne, or deodorant                          Men may shave face and neck.   Do not bring valuables to the hospital. [place].   Contacts, dentures or bridgework may not be worn into surgery.   Bring small overnight bag day of surgery.    Special Instructions: Bring a copy of your healthcare power of attorney and living will documents         the day of surgery if you haven't scanned them in before.              Please read over the following fact sheets you were given:  [facility] - Preparing for Surgery Before surgery, you can play an important role.  Because skin is not sterile, your skin needs to be as free of germs as possible.  You can reduce the number of germs on your skin by washing with CHG (chlorahexidine gluconate) soap before surgery.  CHG is an antiseptic cleaner which kills germs and bonds with  the skin to continue killing germs even after washing. Please DO NOT use if you have an allergy to CHG or antibacterial soaps.  If your skin becomes reddened/irritated stop using the CHG and inform your nurse when you arrive at Short Stay. Do not shave (including legs and underarms) for at least 48 hours prior to the first CHG shower.  You may shave your face/neck.  Please follow these instructions carefully:  1.  Shower with CHG Soap the night before surgery and the  morning of surgery.  2.  If you choose to wash your hair, wash your hair first as usual with your normal  shampoo.  3.  After you shampoo, rinse your hair and body thoroughly to remove the shampoo.  4.  Use CHG as you would any other liquid soap.  You can apply chg directly to the skin and wash.  Gently with a scrungie or clean washcloth.  5.  Apply the CHG Soap to your body ONLY FROM THE NECK DOWN.   Do   not use on face/ open                           Wound or open sores. Avoid contact with eyes, ears mouth and   genitals (private parts).                       Wash face,  Genitals (private parts) with your normal soap.             6.  Wash thoroughly, paying special attention to the area where your    surgery  will be performed.  7.  Thoroughly rinse your body with warm water from the neck down.  8.  DO NOT shower/wash with your normal soap after using and rinsing off the CHG Soap.                9.  Pat yourself dry with a clean towel.            10.  Wear clean pajamas.            11.  Place clean sheets on your bed the night of your first shower and do not  sleep with pets. Day of Surgery : Do not apply any lotions/deodorants the morning of surgery.  Please wear clean clothes to the hospital/surgery center.  FAILURE TO FOLLOW THESE INSTRUCTIONS MAY RESULT IN THE CANCELLATION OF YOUR SURGERY  PATIENT SIGNATURE_________________________________  NURSE  SIGNATURE__________________________________  ________________________________________________________________________

## 2019-06-03 NOTE — Progress Notes (Addendum)
PCP - Dr. Su Ley Cardiologist - Dr. Derl Barrow last office visit 10/04/2015  Chest x-ray - N/A EKG - 05/20/2019 in epic Stress Test - N/A ECHO - 06/03/2019 in epic Cardiac Cath - N/A  Sleep Study - N/A CPAP - N/A  Fasting Blood Sugar - N/A Checks Blood Sugar _N/A____ times a day  Blood Thinner Instructions: Eliquis last dose will be 06/05/2019 Aspirin Instructions:N/A Last Dose:N/A  Anesthesia review: CAD, CABG, HTN, Afib, left voicemail with Pam requested medical clearance note from Dr. Tawanna Solo 06/03/2019 be faxed attention Janett Billow.  Patient denies shortness of breath, fever, cough and chest pain at PAT appointment   Patient verbalized understanding of instructions that were given to them at the PAT appointment. Patient was also instructed that they will need to review over the PAT instructions again at home before surgery.

## 2019-06-03 NOTE — Progress Notes (Signed)
SPOKE W/  Artem     SCREENING SYMPTOMS OF COVID 19:   COUGH--NO  RUNNY NOSE--- NO  SORE THROAT---NO  NASAL CONGESTION----NO  SNEEZING----NO  SHORTNESS OF BREATH---NO  DIFFICULTY BREATHING---NO  TEMP >100.0 -----NO  UNEXPLAINED BODY ACHES------NO  CHILLS -------- NO  HEADACHES ---------NO  LOSS OF SMELL/ TASTE --------NO    HAVE YOU OR ANY FAMILY MEMBER TRAVELLED PAST 14 DAYS OUT OF THE   COUNTY---NO STATE----NO COUNTRY----NO  HAVE YOU OR ANY FAMILY MEMBER BEEN EXPOSED TO ANYONE WITH COVID 19? NO    

## 2019-06-04 ENCOUNTER — Other Ambulatory Visit (HOSPITAL_COMMUNITY)
Admission: RE | Admit: 2019-06-04 | Discharge: 2019-06-04 | Disposition: A | Discharge status: Home / Self Care | Payer: No Typology Code available for payment source | Source: Ambulatory Visit | Attending: Urology | Admitting: Urology

## 2019-06-04 DIAGNOSIS — Z01812 Encounter for preprocedural laboratory examination: Secondary | ICD-10-CM | POA: Diagnosis present

## 2019-06-04 DIAGNOSIS — Z20828 Contact with and (suspected) exposure to other viral communicable diseases: Secondary | ICD-10-CM | POA: Diagnosis not present

## 2019-06-06 LAB — NOVEL CORONAVIRUS, NAA (HOSP ORDER, SEND-OUT TO REF LAB; TAT 18-24 HRS): SARS-CoV-2, NAA: NOT DETECTED

## 2019-06-06 NOTE — Anesthesia Preprocedure Evaluation (Addendum)
Anesthesia Evaluation  Patient identified by MRN, date of birth, ID band Patient awake    Reviewed: Allergy & Precautions, NPO status , Patient's Chart, lab work & pertinent test results  Airway Mallampati: II  TM Distance: >3 FB Neck ROM: Full    Dental no notable dental hx.    Pulmonary neg pulmonary ROS,    Pulmonary exam normal breath sounds clear to auscultation       Cardiovascular hypertension, + CAD and + CABG  Normal cardiovascular exam+ dysrhythmias Atrial Fibrillation  Rhythm:Regular Rate:Normal  Pre-op eval per cardiology   Neuro/Psych negative neurological ROS  negative psych ROS   GI/Hepatic negative GI ROS, Neg liver ROS,   Endo/Other  negative endocrine ROS  Renal/GU negative Renal ROS     Musculoskeletal negative musculoskeletal ROS (+)   Abdominal   Peds  Hematology HLD   Anesthesia Other Findings bladder outlet obstruction  Reproductive/Obstetrics                           Anesthesia Physical Anesthesia Plan  ASA: III  Anesthesia Plan: General   Post-op Pain Management:    Induction: Intravenous  PONV Risk Score and Plan: 2 and Ondansetron, Dexamethasone and Treatment may vary due to age or medical condition  Airway Management Planned: LMA  Additional Equipment:   Intra-op Plan:   Post-operative Plan: Extubation in OR  Informed Consent: I have reviewed the patients History and Physical, chart, labs and discussed the procedure including the risks, benefits and alternatives for the proposed anesthesia with the patient or authorized representative who has indicated his/her understanding and acceptance.     Dental advisory given  Plan Discussed with: CRNA  Anesthesia Plan Comments: (Reviewed PAT note 06/03/2019, Konrad Felix, PA-C)      Anesthesia Quick Evaluation

## 2019-06-06 NOTE — Progress Notes (Addendum)
Anesthesia Chart Review   Case: [id number] Date/Time: 06/08/19 0815   Procedure: TRANSURETHRAL RESECTION OF THE PROSTATE (TURP) (N/A )   Anesthesia type: General   Pre-op diagnosis: bladder outlet obstruction   Location: [facility] PROCEDURE [facility] / [facility]   Surgeon: Ardis Hughs, MD      DISCUSSION:85 y.o. never smoker with h/o HLD, CAD (CABG , A-fib, HTN, pre-diabetes, bladder outlet obstruction scheduled for above procedure 06/08/2019 with Dr. Louis Meckel.   Pt last seen by PCP, Dr. Andrena Mews, 05/20/2019.  Per OV note A-fib stable, may hold Eliquis 2-3 days prior to surgery.  "Scheduled for TURP on Oct 7th. Need Clearance for Surgery.  I contacted his cardiologist and the do not recommend stress test for him to proceed.  EKG done today: NSR at 66 BMP with isolated t-wave inversion on aVL which is almost similar to the previous EKG.  Obtain ECHO.  Check CBC and INR. I will clear him once I have all results."  Per cardiologist, Dr. Candee Furbish, "I would say that he can proceed with TURP from cardiac perspective with no stress test. This is a fairly low risk procedure. He is not having any chest pain or other high risk symptoms.  Hold his Eliquis for 3 days prior to procedure"  Echo 06/03/2019 with EF 55-60%, trace mitral regurgitation without MV stenosis, mild to moderate aortic valve sclerosis without AS.  Cleared by PCP, clearance faxed to urologist, requested.   Anticipate pt can proceed with planned procedure barring acute status change.   VS: BP 132/72   Pulse 64   Temp 36.8 C (Oral)   Resp 18   Ht 6' (1.829 m)   Wt 78 kg   SpO2 100%   BMI 23.33 kg/m   PROVIDERS: Kinnie Feil, MD is PCP   Candee Furbish, MD is Cardiologist  LABS: Labs reviewed: Acceptable for surgery. (all labs ordered are listed, but only abnormal results are displayed)  Labs Reviewed  CBC  HEMOGLOBIN A1C     IMAGES:   EKG: 05/20/2019 Rate 66 bpm Normal sinus rhythm  Normal ECG   Since last tracing sinus rhythm has replaced atrial fibrillation   CV: Echo 06/03/2019 IMPRESSIONS   1. Left ventricular ejection fraction, by visual estimation, is 55 to 60%. The left ventricle has normal function. Normal left ventricular size. There is no left ventricular hypertrophy.  2. Global right ventricle has normal systolic function.The right ventricular size is normal.  3. Left atrial size was normal.  4. Right atrial size was normal.  5. The mitral valve is normal in structure. Trace mitral valve regurgitation. No evidence of mitral stenosis.  6. The tricuspid valve is normal in structure. Tricuspid valve regurgitation is trivial.  7. The aortic valve is tricuspid Aortic valve regurgitation is trivial by color flow Doppler. Mild to moderate aortic valve sclerosis/calcification without any evidence of aortic stenosis.  8. The pulmonic valve was not well visualized. Pulmonic valve regurgitation is not visualized by color flow Doppler.  9. Normal pulmonary artery systolic pressure. 10. The inferior vena cava is normal in size with greater than 50% respiratory variability, suggesting right atrial pressure of 3 mmHg. 11. Septal hypokinesis with overall preserved LV function; trace AI. Past Medical History:  Diagnosis Date  . Atrial fibrillation ([place])   . Bilateral hearing loss   . Bladder outflow obstruction   . Bladder polyp 1963  . BPH (benign prostatic hyperplasia)   . Coronary artery disease   . Hyperlipidemia   .  Hypertension   . Pneumonia    remote history  . Pre-diabetes     Past Surgical History:  Procedure Laterality Date  . bladder polyp excision    . BUNIONECTOMY Right    Right foot  . CORONARY ARTERY BYPASS GRAFT     x1   1982  . HERNIA REPAIR  2012  . left shoulder replacement Left 2014   Done at [facility]  . MOUTH SURGERY    . REVERSE SHOULDER ARTHROPLASTY Right 01/05/2015   Procedure: RIGHT REVERSE SHOULDER ARTHROPLASTY;  Surgeon: Netta Cedars, MD;   Location: [place];  Service: Orthopedics;  Laterality: Right;  . US ECHOCARDIOGRAPHY  2012   pre-op for hernia: wnl  per patient    MEDICATIONS: . apixaban (ELIQUIS) 5 MG TABS tablet  . docusate sodium (COLACE) 100 MG capsule  . ENSURE PLUS (ENSURE PLUS) LIQD  . folic acid (FOLVITE) Q000111Q MCG tablet  . lactulose (CHRONULAC) 10 GM/15ML solution  . Multiple Vitamin (MULTIVITAMIN) tablet  . pravastatin (PRAVACHOL) 20 MG tablet   No current facility-administered medications for this encounter.      Maia Plan [facility] Pre-Surgical Testing [phone] 06/06/19  3:24 PM

## 2019-06-07 NOTE — Progress Notes (Signed)
Surgical clearance from dr. Gwendlyn Deutscher received 06/07/2019 and placed in patient's chart.

## 2019-06-08 ENCOUNTER — Ambulatory Visit (HOSPITAL_COMMUNITY): Payer: Non-veteran care | Admitting: Anesthesiology

## 2019-06-08 ENCOUNTER — Observation Stay (HOSPITAL_COMMUNITY)
Admission: RE | Admit: 2019-06-08 | Discharge: 2019-06-10 | Disposition: A | Discharge status: Home / Self Care | Payer: Non-veteran care | Attending: Urology | Admitting: Urology

## 2019-06-08 ENCOUNTER — Ambulatory Visit (HOSPITAL_COMMUNITY): Payer: Non-veteran care | Admitting: Physician Assistant

## 2019-06-08 ENCOUNTER — Other Ambulatory Visit: Payer: Self-pay

## 2019-06-08 ENCOUNTER — Encounter (HOSPITAL_COMMUNITY)
Admission: RE | Disposition: A | Discharge status: Home / Self Care | Payer: Self-pay | Source: Home / Self Care | Attending: Urology

## 2019-06-08 ENCOUNTER — Encounter (HOSPITAL_COMMUNITY): Payer: Self-pay

## 2019-06-08 DIAGNOSIS — I2510 Atherosclerotic heart disease of native coronary artery without angina pectoris: Secondary | ICD-10-CM | POA: Diagnosis not present

## 2019-06-08 DIAGNOSIS — R2689 Other abnormalities of gait and mobility: Secondary | ICD-10-CM | POA: Insufficient documentation

## 2019-06-08 DIAGNOSIS — N320 Bladder-neck obstruction: Secondary | ICD-10-CM | POA: Insufficient documentation

## 2019-06-08 DIAGNOSIS — R351 Nocturia: Secondary | ICD-10-CM | POA: Diagnosis not present

## 2019-06-08 DIAGNOSIS — Z96619 Presence of unspecified artificial shoulder joint: Secondary | ICD-10-CM | POA: Diagnosis not present

## 2019-06-08 DIAGNOSIS — R338 Other retention of urine: Secondary | ICD-10-CM | POA: Diagnosis not present

## 2019-06-08 DIAGNOSIS — E785 Hyperlipidemia, unspecified: Secondary | ICD-10-CM | POA: Diagnosis not present

## 2019-06-08 DIAGNOSIS — R54 Age-related physical debility: Secondary | ICD-10-CM | POA: Insufficient documentation

## 2019-06-08 DIAGNOSIS — Z7901 Long term (current) use of anticoagulants: Secondary | ICD-10-CM | POA: Diagnosis not present

## 2019-06-08 DIAGNOSIS — Z951 Presence of aortocoronary bypass graft: Secondary | ICD-10-CM | POA: Insufficient documentation

## 2019-06-08 DIAGNOSIS — Z79899 Other long term (current) drug therapy: Secondary | ICD-10-CM | POA: Diagnosis not present

## 2019-06-08 DIAGNOSIS — I480 Paroxysmal atrial fibrillation: Secondary | ICD-10-CM | POA: Insufficient documentation

## 2019-06-08 DIAGNOSIS — N400 Enlarged prostate without lower urinary tract symptoms: Secondary | ICD-10-CM | POA: Diagnosis present

## 2019-06-08 DIAGNOSIS — I10 Essential (primary) hypertension: Secondary | ICD-10-CM | POA: Insufficient documentation

## 2019-06-08 DIAGNOSIS — N401 Enlarged prostate with lower urinary tract symptoms: Principal | ICD-10-CM | POA: Insufficient documentation

## 2019-06-08 HISTORY — PX: TRANSURETHRAL RESECTION OF PROSTATE: SHX73

## 2019-06-08 LAB — BASIC METABOLIC PANEL
Anion gap: 6 (ref 5–15)
BUN: 16 mg/dL (ref 8–23)
CO2: 24 mmol/L (ref 22–32)
Calcium: 7.6 mg/dL — ABNORMAL LOW (ref 8.9–10.3)
Chloride: 108 mmol/L (ref 98–111)
Creatinine, Ser: 0.88 mg/dL (ref 0.61–1.24)
GFR calc Af Amer: >60 mL/min (ref >60)
GFR calc non Af Amer: >60 mL/min (ref >60)
Glucose, Bld: 131 mg/dL — ABNORMAL HIGH (ref 70–99)
Potassium: 4.4 mmol/L (ref 3.5–5.1)
Sodium: 138 mmol/L (ref 135–145)

## 2019-06-08 LAB — CBC
HCT: 37.1 % — ABNORMAL LOW (ref 39.0–52.0)
Hemoglobin: 12.1 g/dL — ABNORMAL LOW (ref 13.0–17.0)
MCH: 32.4 pg (ref 26.0–34.0)
MCHC: 32.6 g/dL (ref 30.0–36.0)
MCV: 99.2 fL (ref 80.0–100.0)
Platelets: 158 10*3/uL (ref 150–400)
RBC: 3.74 MIL/uL — ABNORMAL LOW (ref 4.22–5.81)
RDW: 12.8 % (ref 11.5–15.5)
WBC: 4.2 10*3/uL (ref 4.0–10.5)
nRBC: 0 % (ref 0.0–0.2)

## 2019-06-08 SURGERY — TURP (TRANSURETHRAL RESECTION OF PROSTATE)
Anesthesia: General | Site: Prostate

## 2019-06-08 MED ORDER — 0.9 % SODIUM CHLORIDE (POUR BTL) OPTIME
TOPICAL | Status: DC | PRN
  Administered 2019-06-08: 1000 mL

## 2019-06-08 MED ORDER — LACTULOSE 10 GM/15ML PO SOLN: 10.0000 g | Freq: Every day | ORAL | Status: DC | PRN

## 2019-06-08 MED ORDER — FOLIC ACID 1 MG PO TABS
1000.0000 ug | ORAL_TABLET | Freq: Every day | ORAL | Status: DC
  Administered 2019-06-08 – 2019-06-10 (×3): 1 mg via ORAL
  Filled 2019-06-08 (×3): qty 1

## 2019-06-08 MED ORDER — DOCUSATE SODIUM 100 MG PO CAPS: 100.0000 mg | ORAL_CAPSULE | Freq: Every day | ORAL | Status: DC | PRN

## 2019-06-08 MED ORDER — FENTANYL CITRATE (PF) 100 MCG/2ML IJ SOLN
INTRAMUSCULAR | Status: AC
  Filled 2019-06-08: qty 2

## 2019-06-08 MED ORDER — ONDANSETRON HCL 4 MG/2ML IJ SOLN: 4.0000 mg | Freq: Once | INTRAMUSCULAR | Status: DC | PRN

## 2019-06-08 MED ORDER — ONDANSETRON HCL 4 MG/2ML IJ SOLN
INTRAMUSCULAR | Status: AC
  Filled 2019-06-08: qty 2

## 2019-06-08 MED ORDER — PROPOFOL 10 MG/ML IV BOLUS
INTRAVENOUS | Status: DC | PRN
  Administered 2019-06-08: 140 mg via INTRAVENOUS
  Administered 2019-06-08: 20 mg via INTRAVENOUS

## 2019-06-08 MED ORDER — LIDOCAINE HCL URETHRAL/MUCOSAL 2 % EX GEL
CUTANEOUS | Status: DC | PRN
  Administered 2019-06-08: 1 via URETHRAL

## 2019-06-08 MED ORDER — BELLADONNA ALKALOIDS-OPIUM 16.2-30 MG RE SUPP
RECTAL | Status: AC
  Filled 2019-06-08: qty 1

## 2019-06-08 MED ORDER — LACTATED RINGERS IV SOLN
INTRAVENOUS | Status: DC
  Administered 2019-06-08: 07:00:00 via INTRAVENOUS

## 2019-06-08 MED ORDER — DEXAMETHASONE SODIUM PHOSPHATE 10 MG/ML IJ SOLN
INTRAMUSCULAR | Status: AC
  Filled 2019-06-08: qty 1

## 2019-06-08 MED ORDER — ONDANSETRON HCL 4 MG/2ML IJ SOLN
INTRAMUSCULAR | Status: DC | PRN
  Administered 2019-06-08: 4 mg via INTRAVENOUS

## 2019-06-08 MED ORDER — TRAMADOL HCL 50 MG PO TABS
50.0000 mg | ORAL_TABLET | Freq: Four times a day (QID) | ORAL | Status: DC | PRN
  Administered 2019-06-09: 50 mg via ORAL
  Filled 2019-06-08 (×2): qty 1

## 2019-06-08 MED ORDER — ACETAMINOPHEN 500 MG PO TABS
1000.0000 mg | ORAL_TABLET | Freq: Once | ORAL | Status: AC
  Administered 2019-06-08: 07:00:00 1000 mg via ORAL
  Filled 2019-06-08: qty 2

## 2019-06-08 MED ORDER — PHENYLEPHRINE HCL (PRESSORS) 10 MG/ML IV SOLN
INTRAVENOUS | Status: DC | PRN
  Administered 2019-06-08: 80 ug via INTRAVENOUS

## 2019-06-08 MED ORDER — LIDOCAINE HCL URETHRAL/MUCOSAL 2 % EX GEL
CUTANEOUS | Status: AC
  Filled 2019-06-08: qty 5

## 2019-06-08 MED ORDER — SODIUM CHLORIDE 0.45 % IV SOLN
INTRAVENOUS | Status: DC
  Administered 2019-06-08: 15:00:00 via INTRAVENOUS

## 2019-06-08 MED ORDER — ACETAMINOPHEN 500 MG PO TABS: 1000.0000 mg | ORAL_TABLET | Freq: Four times a day (QID) | ORAL | Status: DC | PRN

## 2019-06-08 MED ORDER — DEXAMETHASONE SODIUM PHOSPHATE 10 MG/ML IJ SOLN
INTRAMUSCULAR | Status: DC | PRN
  Administered 2019-06-08: 8 mg via INTRAVENOUS

## 2019-06-08 MED ORDER — ADULT MULTIVITAMIN W/MINERALS CH
1.0000 | ORAL_TABLET | Freq: Every day | ORAL | Status: DC
  Administered 2019-06-08 – 2019-06-10 (×3): 1 via ORAL
  Filled 2019-06-08 (×3): qty 1

## 2019-06-08 MED ORDER — ONDANSETRON HCL 4 MG/2ML IJ SOLN
4.0000 mg | Freq: Four times a day (QID) | INTRAMUSCULAR | Status: DC | PRN
  Administered 2019-06-09 – 2019-06-10 (×2): 4 mg via INTRAVENOUS
  Filled 2019-06-08 (×3): qty 2

## 2019-06-08 MED ORDER — SODIUM CHLORIDE 0.9 % IR SOLN
Status: DC | PRN
  Administered 2019-06-08: 36000 mL

## 2019-06-08 MED ORDER — PHENYLEPHRINE 40 MCG/ML (10ML) SYRINGE FOR IV PUSH (FOR BLOOD PRESSURE SUPPORT)
PREFILLED_SYRINGE | INTRAVENOUS | Status: AC
  Filled 2019-06-08: qty 10

## 2019-06-08 MED ORDER — FENTANYL CITRATE (PF) 100 MCG/2ML IJ SOLN: 25.0000 ug | INTRAMUSCULAR | Status: DC | PRN

## 2019-06-08 MED ORDER — FENTANYL CITRATE (PF) 100 MCG/2ML IJ SOLN
INTRAMUSCULAR | Status: DC | PRN
  Administered 2019-06-08 (×2): 25 ug via INTRAVENOUS
  Administered 2019-06-08: 50 ug via INTRAVENOUS

## 2019-06-08 MED ORDER — CEFAZOLIN SODIUM-DEXTROSE 2-4 GM/100ML-% IV SOLN
2.0000 g | INTRAVENOUS | Status: AC
  Administered 2019-06-08: 2 g via INTRAVENOUS
  Filled 2019-06-08: qty 100

## 2019-06-08 MED ORDER — BELLADONNA ALKALOIDS-OPIUM 16.2-60 MG RE SUPP
RECTAL | Status: DC | PRN
  Administered 2019-06-08: 1 via RECTAL

## 2019-06-08 MED ORDER — PRAVASTATIN SODIUM 20 MG PO TABS
20.0000 mg | ORAL_TABLET | Freq: Every day | ORAL | Status: DC
  Administered 2019-06-08 – 2019-06-09 (×2): 20 mg via ORAL
  Filled 2019-06-08 (×2): qty 1

## 2019-06-08 MED ORDER — PROPOFOL 10 MG/ML IV BOLUS
INTRAVENOUS | Status: AC
  Filled 2019-06-08: qty 20

## 2019-06-08 MED ORDER — SODIUM CHLORIDE 0.9 % IR SOLN
3000.0000 mL | Status: DC
  Administered 2019-06-08: 3000 mL

## 2019-06-08 MED ORDER — LIDOCAINE 2% (20 MG/ML) 5 ML SYRINGE
INTRAMUSCULAR | Status: AC
  Filled 2019-06-08: qty 5

## 2019-06-08 MED ORDER — ENSURE ENLIVE PO LIQD
237.0000 mL | ORAL | Status: DC
  Administered 2019-06-08 – 2019-06-10 (×2): 237 mL via ORAL

## 2019-06-08 MED ORDER — LIDOCAINE HCL (CARDIAC) PF 100 MG/5ML IV SOSY
PREFILLED_SYRINGE | INTRAVENOUS | Status: DC | PRN
  Administered 2019-06-08: 60 mg via INTRAVENOUS

## 2019-06-08 SURGICAL SUPPLY — 21 items
BAG URINE DRAINAGE (UROLOGICAL SUPPLIES) ×2 IMPLANT
BAG URO CATCHER STRL LF (MISCELLANEOUS) ×2 IMPLANT
CATH FOLEY 2WAY SLVR 30CC 24FR (CATHETERS) IMPLANT
CATH FOLEY 3WAY 30CC 22FR (CATHETERS) IMPLANT
CATH FOLEY 3WAY 30CC 24FR (CATHETERS) ×1
CATH URTH STD 24FR FL 3W 2 (CATHETERS) ×1 IMPLANT
COVER WAND RF STERILE (DRAPES) IMPLANT
ELECT REM PT RETURN 15FT ADLT (MISCELLANEOUS) ×2 IMPLANT
GLOVE BIO SURGEON STRL SZ7.5 (GLOVE) ×2 IMPLANT
GOWN STRL REUS W/ TWL XL LVL3 (GOWN DISPOSABLE) ×1 IMPLANT
GOWN STRL REUS W/TWL XL LVL3 (GOWN DISPOSABLE) ×3 IMPLANT
HOLDER FOLEY CATH W/STRAP (MISCELLANEOUS) ×2 IMPLANT
KIT TURNOVER KIT A (KITS) ×2 IMPLANT
LOOP CUT BIPOLAR 24F LRG (ELECTROSURGICAL) ×2 IMPLANT
MANIFOLD NEPTUNE II (INSTRUMENTS) ×2 IMPLANT
PACK CYSTO (CUSTOM PROCEDURE TRAY) ×2 IMPLANT
SET ASPIRATION TUBING (TUBING) ×2 IMPLANT
SYR 30ML LL (SYRINGE) ×2 IMPLANT
SYRINGE IRR TOOMEY STRL 70CC (SYRINGE) ×2 IMPLANT
TUBING CONNECTING 10 (TUBING) ×2 IMPLANT
TUBING UROLOGY SET (TUBING) ×2 IMPLANT

## 2019-06-08 NOTE — Plan of Care (Signed)
Continue current POC 

## 2019-06-08 NOTE — H&P (Signed)
Cc: BPH  History of present illness: 85 year old male with a history of urinary urgency and frequency with associated nocturia.  His symptoms have been present for many years.  At this time he is getting up 5 or 6 times at night and unable to really sleep.  He is anxious to improve this circumstance.  In the office the patient was noted to have an obstructive prostate with some trabeculations within his bladder.  Otherwise his evaluation was negative for stricture or any evidence of malignancy.  The patient has a past medical history of A. fib for which he takes Eliquis.  He has been cleared by cardiology to stop his Eliquis temporarily.  Review of systems: A 12 point comprehensive review of systems was obtained and is negative unless otherwise stated in the history of present illness.  Patient Active Problem List   Diagnosis Date Noted  . Cautious gait 04/19/2019  . Paroxysmal atrial fibrillation ([place]) 10/04/2015  . Chronic anticoagulation 10/04/2015  . Loss of weight 07/06/2015  . BPH (benign prostatic hyperplasia) 07/06/2015  . S/P shoulder replacement 01/05/2015  . Hypertension 08/04/2011  . Hyperlipidemia with target LDL less than 100 08/04/2011  . CAD (coronary artery disease) 08/04/2011    No current facility-administered medications on file prior to encounter.    Current Outpatient Medications on File Prior to Encounter  Medication Sig Dispense Refill  . apixaban (ELIQUIS) 5 MG TABS tablet Take 1 tablet (5 mg total) by mouth 2 (two) times daily. 60 tablet 11  . folic acid (FOLVITE) [id number] MCG tablet Take 800 mcg by mouth daily.     Marland Kitchen lactulose (CHRONULAC) 10 GM/15ML solution Take 15 mLs (10 g total) by mouth daily as needed for mild constipation. 120 mL 0  . Multiple Vitamin (MULTIVITAMIN) tablet Take 1 tablet by mouth daily.      . pravastatin (PRAVACHOL) 20 MG tablet Take 1 tablet (20 mg total) by mouth at bedtime. 90 tablet 2  . docusate sodium (COLACE) 100 MG capsule Take 100  mg by mouth daily as needed for mild constipation.    . ENSURE PLUS (ENSURE PLUS) LIQD Take 237 mLs by mouth every other day.       Past Medical History:  Diagnosis Date  . Atrial fibrillation ([place])   . Bilateral hearing loss   . Bladder outflow obstruction   . Bladder polyp 1963  . BPH (benign prostatic hyperplasia)   . Coronary artery disease   . Hyperlipidemia   . Hypertension   . Pneumonia    remote history  . Pre-diabetes     Past Surgical History:  Procedure Laterality Date  . bladder polyp excision    . BUNIONECTOMY Right    Right foot  . CORONARY ARTERY BYPASS GRAFT     x1   1982  . HERNIA REPAIR  2012  . left shoulder replacement Left 2014   Done at [facility]  . MOUTH SURGERY    . REVERSE SHOULDER ARTHROPLASTY Right 01/05/2015   Procedure: RIGHT REVERSE SHOULDER ARTHROPLASTY;  Surgeon: Netta Cedars, MD;  Location: [place];  Service: Orthopedics;  Laterality: Right;  . US ECHOCARDIOGRAPHY  2012   pre-op for hernia: wnl  per patient    Social History   Tobacco Use  . Smoking status: Never Smoker  . Smokeless tobacco: Never Used  Substance Use Topics  . Alcohol use: Yes    Alcohol/week: 1.0 standard drinks    Types: 1 Shots of liquor per week    Comment:  Less than 1 drink per week  . Drug use: No    Family History  Problem Relation Age of Onset  . Diabetes Mother   . Stroke Mother   . Cancer Father 81       prostate ca  . Obesity Daughter     PE: Vitals:   06/08/19 0625 06/08/19 0635  BP: (!) 157/84   Pulse: 85   Resp: 18   Temp: 97.6 F (36.4 C)   TempSrc: Oral   SpO2: 99%   Weight:  78 kg  Height:  6' (1.829 m)   Patient appears to be in no acute distress  patient is alert and oriented x3 Atraumatic normocephalic head No cervical or supraclavicular lymphadenopathy appreciated No increased work of breathing, no audible wheezes/rhonchi Regular sinus rhythm/rate Abdomen is soft, nontender, nondistended, no CVA or suprapubic tenderness Lower  extremities are symmetric without appreciable edema Grossly neurologically intact No identifiable skin lesions  No results for input(s): WBC, HGB, HCT in the last 72 hours. No results for input(s): NA, K, CL, CO2, GLUCOSE, BUN, CREATININE, CALCIUM in the last 72 hours. No results for input(s): LABPT, INR in the last 72 hours. No results for input(s): LABURIN in the last 72 hours. Results for orders placed or performed during the hospital encounter of 06/04/19  Novel Coronavirus, NAA (Hosp order, Send-out to Ref Lab; TAT 18-24 hrs     Status: None   Collection Time: 06/04/19  9:35 AM   Specimen: Nasopharyngeal Swab; Respiratory  Result Value Ref Range Status   SARS-CoV-2, NAA NOT DETECTED NOT DETECTED Final    Comment: (NOTE) This nucleic acid amplification test was developed and its performance characteristics determined by [employer] and [employer]. Nucleic acid amplification tests include PCR and TMA. This test has not been FDA cleared or approved. This test has been authorized by FDA under an Emergency Use Authorization (EUA). This test is only authorized for the duration of time the declaration that circumstances exist justifying the authorization of the emergency use of in vitro diagnostic tests for detection of SARS-CoV-2 virus and/or diagnosis of COVID-19 infection under section 564(b)(1) of the Act, 21 U.S.C. [id number]) (1), unless the authorization is terminated or revoked sooner. When diagnostic testing is negative, the possibility of a false negative result should be considered in the context of a patient's recent exposures and the presence of clinical signs and symptoms consistent with COVID-19. An individual without symptoms of COVID- 19 and who is not shedding SARS-CoV-2 vi rus would expect to have a negative (not detected) result in this assay. Performed At: [facility] [address] [place], [place] [id number] Rush Farmer MD [id number]    [place]  Final    Comment: Performed at [facility], [place] [address], [place], [place] [address]    Imaging: none  Imp: Benign prostatic hyperplasia with lower urinary tract symptoms including frequency, urgency, and nocturia  Recommendations: Plan is to proceed to the operating room for transurethral resection of his prostate.  We have discussed this in detail, and he has been appropriately evaluated.  We discussed the risks and the benefits of the operation.  Having gone through evaluation and prolonged discussion the patient has opted to proceed.  He has held his Eliquis for 3 days prior to the procedure.    Ardis Hughs

## 2019-06-08 NOTE — Transfer of Care (Signed)
Immediate Anesthesia Transfer of Care Note  Patient: Brian Manning  Procedure(s) Performed: TRANSURETHRAL RESECTION OF THE PROSTATE (TURP) (N/A Prostate)  Patient Location: PACU  Anesthesia Type:General  Level of Consciousness: drowsy, patient cooperative and responds to stimulation  Airway & Oxygen Therapy: Patient Spontanous Breathing and Patient connected to face mask oxygen  Post-op Assessment: Report given to RN and Post -op Vital signs reviewed and stable  Post vital signs: Reviewed and stable  Last Vitals:  Vitals Value Taken Time  BP 113/72 06/08/19 1035  Temp    Pulse 76 06/08/19 1038  Resp 12 06/08/19 1038  SpO2 100 % 06/08/19 1038  Vitals shown include unvalidated device data.  Last Pain:  Vitals:   06/08/19 0652  TempSrc:   PainSc: 0-No pain         Complications: No apparent anesthesia complications

## 2019-06-08 NOTE — Anesthesia Procedure Notes (Signed)
Procedure Name: LMA Insertion Date/Time: 06/08/2019 8:41 AM Performed by: Glory Buff, CRNA Pre-anesthesia Checklist: Patient identified, Emergency Drugs available, Suction available and Patient being monitored Patient Re-evaluated:Patient Re-evaluated prior to induction Oxygen Delivery Method: Circle system utilized Preoxygenation: Pre-oxygenation with 100% oxygen Induction Type: IV induction Ventilation: Mask ventilation without difficulty LMA: LMA inserted LMA Size: 4.0 Number of attempts: 1 Placement Confirmation: positive ETCO2 Tube secured with: Tape Dental Injury: Teeth and Oropharynx as per pre-operative assessment

## 2019-06-08 NOTE — Anesthesia Postprocedure Evaluation (Signed)
Anesthesia Post Note  Patient: RAHEIM KOSMATKA  Procedure(s) Performed: TRANSURETHRAL RESECTION OF THE PROSTATE (TURP) (N/A Prostate)     Patient location during evaluation: PACU Anesthesia Type: General Level of consciousness: awake Pain management: pain level controlled Vital Signs Assessment: post-procedure vital signs reviewed and stable Respiratory status: spontaneous breathing, nonlabored ventilation, respiratory function stable and patient connected to nasal cannula oxygen Cardiovascular status: blood pressure returned to baseline and stable Postop Assessment: no apparent nausea or vomiting Anesthetic complications: no    Last Vitals:  Vitals:   06/08/19 1200 06/08/19 1300  BP: (!) 142/73 122/72  Pulse: 75 82  Resp: 15 15  Temp:    SpO2: 99% 100%    Last Pain:  Vitals:   06/08/19 1300  TempSrc:   PainSc: 0-No pain                 Maisa Bedingfield P Lorenz Donley

## 2019-06-08 NOTE — Op Note (Signed)
Preoperative diagnosis:  1. Bladder outlet obstruction   Postoperative diagnosis:  1. same   Procedure:  1. Transurethral resection of prostate  Surgeon: Ardis Hughs, MD   Anesthesia: General   Complications: None   Intraoperative findings: Cystoscopy demonstrated a heavy trabeculated bladder mucosa without tumor/stones/or other abnormalities.  UOs were orthopic.  Prostate demonstrated a high median bar with lateral lobe obstruction.  EBL: 200cc   Specimens: Prostate chips  Indication: Brian Manning is a 85 y.o. patient with bladder outlet obstruction/urinary retention/obstructive voiding symptoms.  After reviewing the management options for treatment, he elected to proceed with the above surgical procedure(s). We have discussed the potential benefits and risks of the procedure, side effects of the proposed treatment, the likelihood of the patient achieving the goals of the procedure, and any potential problems that might occur during the procedure or recuperation. Informed consent has been obtained.  Description of procedure:  The patient was taken to the operating room and general anesthesia was induced. The patient was placed in the dorsal lithotomy position, prepped and draped in the usual sterile fashion, and preoperative antibiotics were administered. A preoperative time-out was performed.   I then gently passed the 21 French 30 cystoscope into the patient's urethra and up into the bladder under visual guidance. A 360 cystoscopic evaluation was then performed with the above findings.  I then removed the 21 French cystoscopic sheath and replacement with a 26 French resectoscope sheath was passed and using the visual obturator under direct vision. An exchange the obturator for the resectoscope itself and the loop cautery. I then proceeded to create grooves within the prostate at the 7:00 position extending the defect from the bladder neck at 7:00 down to the prostate apex  just lateral to the verumontanum. I took this groove down to the capsule. I then performed a similar maneuver on the patient's left side at the 5:00 position taking the prostate down from the bladder neck to the apex and down the prostatic capsule. At this point I proceeded to resect the patient's right lateral lobe in a systematic fashion moving from the 7:00 position up to approximately 11. The resection was taken down to the prostate capsule. Hemostasis was achieved on this side prior to moving to the patient's left lateral lobe. A similar sequence was performed taking the prostate down from the 5:00 position to approximately the 1:00 position to the level of the prostatic capsule. I then completed the resection of the posterior wall as well as the area between 5:00 and 7:00 along the bladder neck. I was careful not to undermine the bladder neck or resect the inferior. I then did resect the area that was protruding into the prostatic urethra anteriorly.   Once I was satisfied that the prostate had been adequately resected I evacuated the prostate chips using a To[place]y syringe. I then reintroduced the resectoscope and ensured adequate hemostasis. I then left the bladder full and removed the resectoscope entirely. Exam under anesthesia demonstrated a normal sized prostate with no nodules.  I then placed a 22 French three-way Foley catheter. I irrigated the catheter gently and removed all debris and clots. I then placed the patient on gentle continuous bladder irrigation.  He subsequently extubated and returned to PACU in excellent condition.  Ardis Hughs, MD

## 2019-06-08 NOTE — Interval H&P Note (Signed)
History and Physical Interval Note:  06/08/2019 8:30 AM  Brian Manning  has presented today for surgery, with the diagnosis of bladder outlet obstruction.  The various methods of treatment have been discussed with the patient and family. After consideration of risks, benefits and other options for treatment, the patient has consented to  Procedure(s): TRANSURETHRAL RESECTION OF THE PROSTATE (TURP) (N/A) as a surgical intervention.  The patient's history has been reviewed, patient examined, no change in status, stable for surgery.  I have reviewed the patient's chart and labs.  Questions were answered to the patient's satisfaction.     Ardis Hughs

## 2019-06-09 ENCOUNTER — Encounter (HOSPITAL_COMMUNITY): Payer: Self-pay | Admitting: Urology

## 2019-06-09 DIAGNOSIS — N401 Enlarged prostate with lower urinary tract symptoms: Secondary | ICD-10-CM | POA: Diagnosis not present

## 2019-06-09 MED ORDER — ACETAMINOPHEN 500 MG PO TABS: 500.0000 mg | ORAL_TABLET | Freq: Four times a day (QID) | ORAL | 0 refills | Status: DC | PRN

## 2019-06-09 MED ORDER — APIXABAN 5 MG PO TABS: 5.0000 mg | ORAL_TABLET | Freq: Two times a day (BID) | ORAL | 11 refills | Status: AC

## 2019-06-09 MED ORDER — PHENAZOPYRIDINE HCL 200 MG PO TABS
200.0000 mg | ORAL_TABLET | Freq: Three times a day (TID) | ORAL | Status: DC | PRN
  Administered 2019-06-09: 200 mg via ORAL
  Filled 2019-06-09 (×2): qty 1

## 2019-06-09 MED ORDER — KETOROLAC TROMETHAMINE 15 MG/ML IJ SOLN: 15.0000 mg | Freq: Four times a day (QID) | INTRAMUSCULAR | Status: DC | PRN

## 2019-06-09 NOTE — Progress Notes (Signed)
Patient voiding small amounts throughout the day.  PVRs are okay.  Patient anxious because he lives alone.  We will cancel his discharge and keep him one more night for observation.  I would like to avoid placing a foley, but would encourage straight cathing if he is feeling full and cannot void or if his BS is >400 or 500cc.    I have ordered some pyridium and toradol for his discomfort.

## 2019-06-09 NOTE — Progress Notes (Signed)
Bladder scan performed showing a range of 170-240. Pt still voiding 70ml at a time. MD paged. Orders received to cancel d/c at this time and In and out cath with bladder scan showing >500 ml or pt feeling full like he needs to void. Will continue to monitor.

## 2019-06-09 NOTE — Discharge Summary (Signed)
Date of admission: 06/08/2019  Date of discharge: 06/09/2019  Admission diagnosis: bladder outlet obstruction  Discharge diagnosis: same  Secondary diagnoses:  Patient Active Problem List   Diagnosis Date Noted  . Cautious gait 04/19/2019  . Paroxysmal atrial fibrillation ([place]) 10/04/2015  . Chronic anticoagulation 10/04/2015  . Loss of weight 07/06/2015  . BPH (benign prostatic hyperplasia) 07/06/2015  . S/P shoulder replacement 01/05/2015  . Hypertension 08/04/2011  . Hyperlipidemia with target LDL less than 100 08/04/2011  . CAD (coronary artery disease) 08/04/2011    Procedures performed: Procedure(s): TRANSURETHRAL RESECTION OF THE PROSTATE (TURP)  History and Physical: For full details, please see admission history and physical. Briefly, Brian Manning is a 85 y.o. year old patient with lower urinary tract symptoms secondary to bladder outlet obstruction.   Hospital Course: Patient tolerated the procedure well.  He was then transferred to the floor after an uneventful PACU stay.  His hospital course was uncomplicated.  On POD#1 he had met discharge criteria: was eating a regular diet, was up and ambulating independently,  pain was well controlled, was voiding without a catheter, and was ready to for discharge.  PE:  NAD Vitals:   06/08/19 1416 06/08/19 1838 06/08/19 2035 06/09/19 0449  BP: 133/71 (!) 134/57 (!) 111/54 121/61  Pulse: 80 75 65 61  Resp: 18 19 18 18   Temp: 98.2 F (36.8 C) 98.6 F (37 C) 98.4 F (36.9 C) 97.8 F (36.6 C)  TempSrc:  Oral Oral Oral  SpO2: 99% 95% 95% 95%  Weight:      Height:        Intake/Output Summary (Last 24 hours) at 06/09/2019 0835 Last data filed at 06/09/2019 0600 Gross per 24 hour  Intake 6756.37 ml  Output 9775 ml  Net -3018.63 ml  Non-labored breathing Abdomen is soft Urine straw colored on very slow CBI gtt Extremities are symmetric, no swelling  Laboratory values:  Recent Labs    06/08/19 1110  WBC 4.2  HGB  12.1*  HCT 37.1*   Recent Labs    06/08/19 1110  NA 138  K 4.4  CL 108  CO2 24  GLUCOSE 131*  BUN 16  CREATININE 0.88  CALCIUM 7.6*   No results for input(s): LABPT, INR in the last 72 hours. No results for input(s): LABURIN in the last 72 hours. Results for orders placed or performed during the hospital encounter of 06/04/19  Novel Coronavirus, NAA (Hosp order, Send-out to Ref Lab; TAT 18-24 hrs     Status: None   Collection Time: 06/04/19  9:35 AM   Specimen: Nasopharyngeal Swab; Respiratory  Result Value Ref Range Status   SARS-CoV-2, NAA NOT DETECTED NOT DETECTED Final    Comment: (NOTE) This nucleic acid amplification test was developed and its performance characteristics determined by [employer], [employer] and [employer]. Nucleic acid amplification tests include PCR and TMA. This test has not been FDA cleared or approved. This test has been authorized by FDA under an Emergency Use Authorization (EUA). This test is only authorized for the duration of time the declaration that circumstances exist justifying the authorization of the emergency use of in vitro diagnostic tests for detection of SARS-CoV-2 virus and/or diagnosis of COVID-19 infection under section 564(b)(1) of the Act, 21 U.S.C. 143OOI-7(N) (1), unless the authorization is terminated or revoked sooner. When diagnostic testing is negative, the possibility of a false negative result should be considered in the context of a patient's recent exposures and the presence of clinical signs and symptoms consistent  with COVID-19. An individual without symptoms of COVID- 19 and who is not shedding SARS-CoV-2 vi rus would expect to have a negative (not detected) result in this assay. Performed At: [facility] [address] [place], [place] [id number] Rush Farmer MD [id number]    [place]  Final    Comment: Performed at [facility], [place] [address] [place], [place] [address]     Disposition: Home  Discharge instruction: The patient was instructed to be ambulatory but told to refrain from heavy lifting, strenuous activity, or driving.   Discharge medications:  Allergies as of 06/09/2019   No Known Allergies     Medication List    TAKE these medications   acetaminophen 500 MG tablet Commonly known as: TYLENOL Take 1-2 tablets (500-1,000 mg total) by mouth every 6 (six) hours as needed for mild pain.   apixaban 5 MG Tabs tablet Commonly known as: ELIQUIS Take 1 tablet (5 mg total) by mouth 2 (two) times daily. Resume 48 hours after the urine is completely clear What changed: additional instructions   docusate sodium 100 MG capsule Commonly known as: COLACE Take 100 mg by mouth daily as needed for mild constipation.   Ensure Plus Liqd Take 237 mLs by mouth every other day.   folic acid 637 MCG tablet Commonly known as: FOLVITE Take 800 mcg by mouth daily.   lactulose 10 GM/15ML solution Commonly known as: CHRONULAC Take 15 mLs (10 g total) by mouth daily as needed for mild constipation.   multivitamin tablet Take 1 tablet by mouth daily.   pravastatin 20 MG tablet Commonly known as: PRAVACHOL Take 1 tablet (20 mg total) by mouth at bedtime.       Followup:  Follow-up Information    Karen Kays, NP On 06/22/2019.   Specialty: Nurse Practitioner Why: 11:30 Contact information: [address] [place] [address] [phone]

## 2019-06-09 NOTE — Discharge Instructions (Signed)
Transurethral Resection of the Prostate (TURP) or Greenlight laser ablation of the Prostate  Care After  Refer to this sheet in the next few weeks. These discharge instructions provide you with general information on caring for yourself after you leave the hospital. Your caregiver may also give you specific instructions. Your treatment has been planned according to the most current medical practices available, but unavoidable complications sometimes occur. If you have any problems or questions after discharge, please call your caregiver.  HOME CARE INSTRUCTIONS   Medications  You may receive medicine for pain management. As your level of discomfort decreases, adjustments in your pain medicines may be made.   Take all medicines as directed.   You may be given a medicine (antibiotic) to kill germs following surgery. Finish all medicines. Let your caregiver know if you have any side effects or problems from the medicine.   If you are on aspirin, it would be best not to restart the aspirin until the blood in the urine clears  Resume Eliquis 48 hours after the urine is completely clear.  Hygiene  You can take a shower after surgery.   You should not take a bath while you still have the urethral catheter. Activity  You will be encouraged to get out of bed as much as possible and increase your activity level as tolerated.   Spend the first week in and around your home. For 3 weeks, avoid the following:   Straining.   Running.   Strenuous work.   Walks longer than a few blocks.   Riding for extended periods.   Sexual relations.   Do not lift heavy objects (more than 20 pounds) for at least 1 month. When lifting, use your arms instead of your abdominal muscles.   You will be encouraged to walk as tolerated. Do not exert yourself. Increase your activity level slowly. Remember that it is important to keep moving after an operation of any type. This cuts down on the possibility of  developing blood clots.   Your caregiver will tell you when you can resume driving and light housework. Discuss this at your first office visit after discharge. Diet  No special diet is ordered after a TURP. However, if you are on a special diet for another medical problem, it should be continued.   Normal fluid intake is usually recommended.   Avoid alcohol and caffeinated drinks for 2 weeks. They irritate the bladder. Decaffeinated drinks are okay.   Avoid spicy foods.  Bladder Function  For the first 10 days, empty the bladder whenever you feel a definite desire. Do not try to hold the urine for long periods of time.   Urinating once or twice a night even after you are healed is not uncommon.   You may see some recurrence of blood in the urine after discharge from the hospital. This usually happens within 2 weeks after the procedure.If this occurs, force fluids again as you did in the hospital and reduce your activity.  Bowel Function  You may experience some constipation after surgery. This can be minimized by increasing fluids and fiber in your diet. Drink enough water and fluids to keep your urine clear or pale yellow.   A stool softener may be prescribed for use at home. Do not strain to move your bowels.   If you are requiring increased pain medicine, it is important that you take stool softeners to prevent constipation. This will help to promote proper healing by reducing the need  to strain to move your bowels.  Sexual Activity  Semen movement in the opposite direction and into the bladder (retrograde ejaculation) may occur. Since the semen passes into the bladder, cloudy urine can occur the first time you urinate after intercourse. Or, you may not have an ejaculation during erection. Ask your caregiver when you can resume sexual activity. Retrograde ejaculation and reduced semen discharge should not reduce one's pleasure of intercourse.  Postoperative Visit  Arrange the date  and time of your after surgery visit with your caregiver.  Return to Work  After your recovery is complete, you will be able to return to work and resume all activities. Your caregiver will inform you when you can return to work.

## 2019-06-09 NOTE — Progress Notes (Signed)
Patient unable to void. Bladder scan showed 126 cc's. Patient very anxious about not being able to void. Patient also had an episode of emesis. Zofran given. On call Urology paged. Per Urology to in and out patient. In and out performed. 250 cc's of  bloody urine with clots returned. Patient stated he felt relief after in and out. Urology aware of bloody urine with clots. New orders received. Will continue to monitor patient closely.

## 2019-06-09 NOTE — Progress Notes (Addendum)
Pt has only voided 75 ml (red/pink) in total post foley removal. Bladder scan volume of 136 ml post void. Pt encouraged to drink.  Louis Meckel, MD was paged/notified.   MD advised to wait until 1600 for the pt to void and bladder scan if pt is unable to. I will continue to monitor.

## 2019-06-10 DIAGNOSIS — N401 Enlarged prostate with lower urinary tract symptoms: Secondary | ICD-10-CM | POA: Diagnosis not present

## 2019-06-10 LAB — SURGICAL PATHOLOGY

## 2019-06-10 NOTE — Progress Notes (Signed)
No change from am assessment. Discharge instructions reviewed. Questions, concerns denied. Pt out of bed 1 assist.

## 2019-06-10 NOTE — Progress Notes (Signed)
Patient voided 50 cc, post void residual bladder scan 202 cc. Pt voided a second time 75 cc, PVR 130 cc. Will continue to monitor and report to urology provider.

## 2019-06-10 NOTE — Progress Notes (Signed)
Voiding better. PVRs acceptable. Urine clearing. Otherwise stable and feels well.  No current facility-administered medications on file prior to encounter.    Current Outpatient Medications on File Prior to Encounter  Medication Sig Dispense Refill  . folic acid (FOLVITE) [id number] MCG tablet Take 800 mcg by mouth daily.     Marland Kitchen lactulose (CHRONULAC) 10 GM/15ML solution Take 15 mLs (10 g total) by mouth daily as needed for mild constipation. 120 mL 0  . Multiple Vitamin (MULTIVITAMIN) tablet Take 1 tablet by mouth daily.      . pravastatin (PRAVACHOL) 20 MG tablet Take 1 tablet (20 mg total) by mouth at bedtime. 90 tablet 2  . docusate sodium (COLACE) 100 MG capsule Take 100 mg by mouth daily as needed for mild constipation.    . ENSURE PLUS (ENSURE PLUS) LIQD Take 237 mLs by mouth every other day.       Intake/Output Summary (Last 24 hours) at 06/10/2019 1118 Last data filed at 06/10/2019 0951 Gross per 24 hour  Intake 1761.66 ml  Output 2625 ml  Net -863.34 ml   Today's Vitals   06/09/19 1405 06/09/19 1457 06/09/19 2045 06/10/19 0540  BP: 135/82  123/60 140/70  Pulse: 67  84 67  Resp: 19  18 16   Temp: 98.1 F (36.7 C)  97.8 F (36.6 C) 98.4 F (36.9 C)  TempSrc: Oral  Oral Oral  SpO2: 100%  94% 95%  Weight:      Height:      PainSc:  3  0-No pain    Body mass index is 23.33 kg/m.  Imp: BPH, frail.  Plan to DC after lunch.

## 2019-07-12 ENCOUNTER — Other Ambulatory Visit: Payer: Self-pay | Admitting: Family Medicine

## 2019-07-12 DIAGNOSIS — R2689 Other abnormalities of gait and mobility: Secondary | ICD-10-CM

## 2019-07-12 DIAGNOSIS — E785 Hyperlipidemia, unspecified: Secondary | ICD-10-CM

## 2019-07-12 DIAGNOSIS — I480 Paroxysmal atrial fibrillation: Secondary | ICD-10-CM

## 2019-07-12 DIAGNOSIS — I10 Essential (primary) hypertension: Secondary | ICD-10-CM

## 2019-07-12 DIAGNOSIS — I2510 Atherosclerotic heart disease of native coronary artery without angina pectoris: Secondary | ICD-10-CM

## 2019-07-15 ENCOUNTER — Telehealth: Payer: Self-pay | Admitting: Family Medicine

## 2019-07-18 NOTE — Chronic Care Management (AMB) (Signed)
  Care Management   Note  07/18/2019 Name: Brian Manning MRN: [id number] DOB: February 11, 1928  Brian Manning is a 85 y.o. year old male who is a primary care patient of Kinnie Feil, MD. I reached out to Janyce Llanos by phone today in response to a referral sent by Brian Manning [facility] health plan.    [name] was given information about care management services today including:  1. Care management services include personalized support from designated clinical staff supervised by his physician, including individualized plan of care and coordination with other care providers 2. 24/7 contact phone numbers for assistance for urgent and routine care needs. 3. The patient may stop care management services at any time by phone call to the office staff.  Patient did not agree to services and wishes to consider information provided before deciding about enrollment in care management services.   Follow up plan: The patient has been provided with contact information for the chronic care management team and has been advised to call with any health related questions or concerns.   Glenna Durand, LPN Health Advisor, Embedded Care Coordination [facility] ??[email or web address] ??[phone]

## 2019-12-28 ENCOUNTER — Other Ambulatory Visit: Payer: Self-pay | Admitting: Family Medicine

## 2020-04-24 ENCOUNTER — Encounter: Payer: Self-pay | Admitting: Family Medicine

## 2020-04-24 ENCOUNTER — Ambulatory Visit (INDEPENDENT_AMBULATORY_CARE_PROVIDER_SITE_OTHER): Payer: Medicare HMO | Admitting: Family Medicine

## 2020-04-24 ENCOUNTER — Other Ambulatory Visit: Payer: Self-pay

## 2020-04-24 VITALS — BP 126/72 | HR 75 | Wt 172.4 lb

## 2020-04-24 DIAGNOSIS — Z0000 Encounter for general adult medical examination without abnormal findings: Secondary | ICD-10-CM | POA: Diagnosis not present

## 2020-04-24 DIAGNOSIS — E785 Hyperlipidemia, unspecified: Secondary | ICD-10-CM

## 2020-04-24 DIAGNOSIS — E1169 Type 2 diabetes mellitus with other specified complication: Secondary | ICD-10-CM

## 2020-04-24 DIAGNOSIS — I4891 Unspecified atrial fibrillation: Secondary | ICD-10-CM | POA: Diagnosis not present

## 2020-04-24 DIAGNOSIS — I25810 Atherosclerosis of coronary artery bypass graft(s) without angina pectoris: Secondary | ICD-10-CM | POA: Diagnosis not present

## 2020-04-24 DIAGNOSIS — I480 Paroxysmal atrial fibrillation: Secondary | ICD-10-CM

## 2020-04-24 MED ORDER — TETANUS-DIPHTH-ACELL PERTUSSIS 5-2-15.5 LF-MCG/0.5 IM SUSP: 0.5000 mL | Freq: Once | INTRAMUSCULAR | 0 refills | Status: AC

## 2020-04-24 NOTE — Progress Notes (Signed)
    SUBJECTIVE:   CHIEF COMPLAINT / HPI:  Well exam: Here for routine well check up. No concerns.   QIO:NGEX for f/u. Currently compliant with Eliquis 5 mg BID. No concerns.  CAD:/HLD:On Pravachol 20 mg at bed time. Here for f/u.   PERTINENT  PMH / PSH: PMX reviewed.  OBJECTIVE:   BP 126/72   Pulse 75   Wt 172 lb 6.4 oz (78.2 kg)   SpO2 97%   BMI 23.38 kg/m   Physical Exam Vitals and nursing note reviewed.  HENT:     Head: Normocephalic.     Comments: Hard of hearing    Right Ear: Tympanic membrane, ear canal and external ear normal.     Left Ear: Tympanic membrane, ear canal and external ear normal.     Mouth/Throat:     Pharynx: Oropharynx is clear.  Eyes:     Extraocular Movements: Extraocular movements intact.     Pupils: Pupils are equal, round, and reactive to light.  Cardiovascular:     Rate and Rhythm: Normal rate and regular rhythm.     Pulses: Normal pulses.     Heart sounds: Normal heart sounds. No murmur heard.   Pulmonary:     Effort: Pulmonary effort is normal. No respiratory distress.     Breath sounds: Normal breath sounds. No wheezing or rhonchi.  Abdominal:     General: Abdomen is flat. Bowel sounds are normal. There is no distension.     Palpations: Abdomen is soft. There is no mass.     Tenderness: There is no abdominal tenderness.  Musculoskeletal:     Right lower leg: No edema.     Left lower leg: No edema.  Neurological:     General: No focal deficit present.     Mental Status: He is oriented to person, place, and time.     Cranial Nerves: No cranial nerve deficit.     Sensory: No sensory deficit.     Coordination: Coordination normal.     Comments: Slow but steady gait  Psychiatric:        Mood and Affect: Mood normal.      ASSESSMENT/PLAN:   Well adult: No acute findings on exam. Up to date with COVID vaccine. He stated that he got his shot with the [place] in January and February of 2021. He will return in Sept for Flu  shot. Tdap discussed. Unclear if his insurance will cover, hence I escribed it to his pharmacy. F/U as needed   CAD/HLD: Continue Pravachol. FLP checked today.  PAF: Stable on Eliquis. No bleed.   Andrena Mews, MD [place]

## 2020-04-24 NOTE — Assessment & Plan Note (Signed)
Stable on Eliquis

## 2020-04-24 NOTE — Assessment & Plan Note (Signed)
Continue Pravachol. FLP checked today.

## 2020-04-24 NOTE — Patient Instructions (Signed)

## 2020-04-25 ENCOUNTER — Telehealth: Payer: Self-pay | Admitting: Family Medicine

## 2020-04-25 LAB — CMP14+EGFR
ALT: 9 IU/L (ref 0–44)
AST: 14 IU/L (ref 0–40)
Albumin/Globulin Ratio: 1.9 (ref 1.2–2.2)
Albumin: 4.4 g/dL (ref 3.5–4.6)
Alkaline Phosphatase: 61 IU/L (ref 48–121)
BUN/Creatinine Ratio: 19 (ref 10–24)
BUN: 19 mg/dL (ref 10–36)
Bilirubin Total: 0.5 mg/dL (ref 0.0–1.2)
CO2: 25 mmol/L (ref 20–29)
Calcium: 9.1 mg/dL (ref 8.6–10.2)
Chloride: 98 mmol/L (ref 96–106)
Creatinine, Ser: 1.02 mg/dL (ref 0.76–1.27)
GFR calc Af Amer: 74 mL/min/{1.73_m2} (ref >59)
GFR calc non Af Amer: 64 mL/min/{1.73_m2} (ref >59)
Globulin, Total: 2.3 g/dL (ref 1.5–4.5)
Glucose: 110 mg/dL — ABNORMAL HIGH (ref 65–99)
Potassium: 4.7 mmol/L (ref 3.5–5.2)
Sodium: 135 mmol/L (ref 134–144)
Total Protein: 6.7 g/dL (ref 6.0–8.5)

## 2020-04-25 LAB — LIPID PANEL
Chol/HDL Ratio: 2.3 ratio (ref 0.0–5.0)
Cholesterol, Total: 110 mg/dL (ref 100–199)
HDL: 47 mg/dL (ref >39)
LDL Chol Calc (NIH): 53 mg/dL (ref 0–99)
Triglycerides: 37 mg/dL (ref 0–149)
VLDL Cholesterol Cal: 10 mg/dL (ref 5–40)

## 2020-04-25 NOTE — Telephone Encounter (Addendum)
Test result from yesterday discussed with him. I already reminded him that he has a Tdap injection prescription at his [facility]. He will go get his shot when able to.  He requested a copy of his result mailed to his home address on file. Copy mailed.

## 2020-06-06 ENCOUNTER — Other Ambulatory Visit: Payer: Self-pay

## 2020-06-06 ENCOUNTER — Ambulatory Visit (INDEPENDENT_AMBULATORY_CARE_PROVIDER_SITE_OTHER): Payer: Medicare HMO

## 2020-06-06 DIAGNOSIS — Z23 Encounter for immunization: Secondary | ICD-10-CM

## 2020-06-06 NOTE — Progress Notes (Signed)
Patient presents in nurse clinic for Flu Vaccine.   Vaccine administered LD without complication.   See admin for details.

## 2020-08-01 ENCOUNTER — Other Ambulatory Visit: Payer: Self-pay | Admitting: Family Medicine

## 2020-12-19 ENCOUNTER — Emergency Department (HOSPITAL_BASED_OUTPATIENT_CLINIC_OR_DEPARTMENT_OTHER): Payer: No Typology Code available for payment source | Admitting: Radiology

## 2020-12-19 ENCOUNTER — Encounter (HOSPITAL_BASED_OUTPATIENT_CLINIC_OR_DEPARTMENT_OTHER): Payer: Self-pay | Admitting: *Deleted

## 2020-12-19 ENCOUNTER — Emergency Department (HOSPITAL_BASED_OUTPATIENT_CLINIC_OR_DEPARTMENT_OTHER)
Admission: EM | Admit: 2020-12-19 | Discharge: 2020-12-19 | Disposition: A | Discharge status: Home / Self Care | Payer: No Typology Code available for payment source | Attending: Emergency Medicine | Admitting: Emergency Medicine

## 2020-12-19 ENCOUNTER — Other Ambulatory Visit: Payer: Self-pay

## 2020-12-19 ENCOUNTER — Telehealth: Payer: Self-pay | Admitting: Family Medicine

## 2020-12-19 DIAGNOSIS — Z7901 Long term (current) use of anticoagulants: Secondary | ICD-10-CM | POA: Diagnosis not present

## 2020-12-19 DIAGNOSIS — I10 Essential (primary) hypertension: Secondary | ICD-10-CM | POA: Insufficient documentation

## 2020-12-19 DIAGNOSIS — S32010A Wedge compression fracture of first lumbar vertebra, initial encounter for closed fracture: Secondary | ICD-10-CM

## 2020-12-19 DIAGNOSIS — S32020A Wedge compression fracture of second lumbar vertebra, initial encounter for closed fracture: Secondary | ICD-10-CM

## 2020-12-19 DIAGNOSIS — S32018A Other fracture of first lumbar vertebra, initial encounter for closed fracture: Secondary | ICD-10-CM | POA: Diagnosis not present

## 2020-12-19 DIAGNOSIS — I2510 Atherosclerotic heart disease of native coronary artery without angina pectoris: Secondary | ICD-10-CM | POA: Diagnosis not present

## 2020-12-19 DIAGNOSIS — W19XXXA Unspecified fall, initial encounter: Secondary | ICD-10-CM | POA: Diagnosis not present

## 2020-12-19 DIAGNOSIS — S32028A Other fracture of second lumbar vertebra, initial encounter for closed fracture: Secondary | ICD-10-CM | POA: Insufficient documentation

## 2020-12-19 DIAGNOSIS — S3992XA Unspecified injury of lower back, initial encounter: Secondary | ICD-10-CM | POA: Diagnosis present

## 2020-12-19 DIAGNOSIS — Z951 Presence of aortocoronary bypass graft: Secondary | ICD-10-CM | POA: Insufficient documentation

## 2020-12-19 DIAGNOSIS — M545 Low back pain, unspecified: Secondary | ICD-10-CM | POA: Diagnosis not present

## 2020-12-19 MED ORDER — ACETAMINOPHEN 325 MG PO TABS
650.0000 mg | ORAL_TABLET | Freq: Once | ORAL | Status: AC
  Administered 2020-12-19: 650 mg via ORAL
  Filled 2020-12-19: qty 2

## 2020-12-19 NOTE — ED Provider Notes (Signed)
[facility] EMERGENCY DEPT Provider Note   CSN: [id number] Arrival date & time: 12/19/20  1116     History Chief Complaint  Patient presents with  . Back Pain    ANTHANY THORNHILL is a 87 y.o. male.  HPI 87 year old male presents with back pain.  It has been ongoing for about 2 weeks.  It is worse when he tries to get up to walk.  He is able to walk but it is painful.  No weakness or numbness in the lower extremities.  No urinary symptoms.  No fevers or weight loss.  No direct trauma such as a fall.  Has not take anything for the pain.  Family wonders if it might be related to him moving into a new bed/living facility over the last couple months but he does not have the rail he normally has to help him pull up.  No abdominal pain.  Past Medical History:  Diagnosis Date  . Atrial fibrillation ([place])   . Bilateral hearing loss   . Bladder outflow obstruction   . Bladder polyp 1963  . BPH (benign prostatic hyperplasia)   . Coronary artery disease   . Hyperlipidemia   . Hypertension   . Pneumonia    remote history  . Pre-diabetes     Patient Active Problem List   Diagnosis Date Noted  . Cautious gait 04/19/2019  . Paroxysmal atrial fibrillation ([place]) 10/04/2015  . Chronic anticoagulation 10/04/2015  . Loss of weight 07/06/2015  . BPH (benign prostatic hyperplasia) 07/06/2015  . S/P shoulder replacement 01/05/2015  . Hypertension 08/04/2011  . Hyperlipidemia with target LDL less than 100 08/04/2011  . CAD (coronary artery disease) 08/04/2011    Past Surgical History:  Procedure Laterality Date  . bladder polyp excision    . BUNIONECTOMY Right    Right foot  . CORONARY ARTERY BYPASS GRAFT     x1   1982  . HERNIA REPAIR  2012  . left shoulder replacement Left 2014   Done at [facility]  . MOUTH SURGERY    . REVERSE SHOULDER ARTHROPLASTY Right 01/05/2015   Procedure: RIGHT REVERSE SHOULDER ARTHROPLASTY;  Surgeon: Netta Cedars, MD;  Location: [facility];  Service:  Orthopedics;  Laterality: Right;  . TRANSURETHRAL RESECTION OF PROSTATE N/A 06/08/2019   Procedure: TRANSURETHRAL RESECTION OF THE PROSTATE (TURP);  Surgeon: Ardis Hughs, MD;  Location: [facility];  Service: Urology;  Laterality: N/A;  . US ECHOCARDIOGRAPHY  2012   pre-op for hernia: wnl  per patient       Family History  Problem Relation Age of Onset  . Diabetes Mother   . Stroke Mother   . Cancer Father 12       prostate ca  . Obesity Daughter     Social History   Tobacco Use  . Smoking status: Never Smoker  . Smokeless tobacco: Never Used  Vaping Use  . Vaping Use: Never used  Substance Use Topics  . Alcohol use: Not Currently    Alcohol/week: 1.0 standard drink    Types: 1 Shots of liquor per week    Comment: Less than 1 drink per week  . Drug use: No    Home Medications Prior to Admission medications   Medication Sig Start Date End Date Taking? Authorizing Provider  apixaban (ELIQUIS) 5 MG TABS tablet Take 1 tablet (5 mg total) by mouth 2 (two) times daily. Resume 48 hours after the urine is completely clear Patient taking differently: Take 2.5 mg by  mouth 2 (two) times daily. Resume 48 hours after the urine is completely clear 06/09/19  Yes Ardis Hughs, MD  folic acid (FOLVITE) 211 MCG tablet Take 800 mcg by mouth daily.   Yes [provider]  Multiple Vitamin (MULTIVITAMIN) tablet Take 1 tablet by mouth daily.   Yes [provider]  Nutritional Supplements (NUTRITIONAL SHAKE HIGH PROTEIN PO) Take 1 Bottle by mouth daily.   Yes [provider]  polyethylene glycol (MIRALAX / GLYCOLAX) 17 g packet Take 17 g by mouth daily.   Yes [provider]  pravastatin (PRAVACHOL) 20 MG tablet TAKE 1 TABLET (20 MG TOTAL) BY MOUTH AT BEDTIME. 08/02/20  Yes Kinnie Feil, MD    Allergies    Patient has no known allergies.  Review of Systems   Review of Systems  Constitutional: Negative for fever and unexpected weight change.   Gastrointestinal: Negative for abdominal pain.  Genitourinary: Negative for dysuria.  Musculoskeletal: Positive for back pain.  Neurological: Negative for weakness and numbness.  All other systems reviewed and are negative.   Physical Exam Updated Vital Signs BP (!) 143/108 (BP Location: Left Arm)   Pulse 92   Temp 97.8 F (36.6 C) (Oral)   Resp 18   Ht 6\' 1"  (1.854 m)   Wt 74.8 kg   SpO2 99%   BMI 21.77 kg/m   Physical Exam Vitals and nursing note reviewed.  Constitutional:      General: He is not in acute distress.    Appearance: He is well-developed. He is not ill-appearing or diaphoretic.  HENT:     Head: Normocephalic and atraumatic.     Right Ear: External ear normal.     Left Ear: External ear normal.     Nose: Nose normal.  Eyes:     General:        Right eye: No discharge.        Left eye: No discharge.  Cardiovascular:     Rate and Rhythm: Normal rate and regular rhythm.     Heart sounds: Normal heart sounds.  Pulmonary:     Effort: Pulmonary effort is normal.     Breath sounds: Normal breath sounds.  Abdominal:     Palpations: Abdomen is soft.     Tenderness: There is no abdominal tenderness. There is no right CVA tenderness or left CVA tenderness.  Musculoskeletal:     Cervical back: Neck supple. No tenderness.     Thoracic back: No tenderness.     Lumbar back: No tenderness.       Back:  Skin:    General: Skin is warm and dry.  Neurological:     Mental Status: He is alert.     Comments: 5/5 strength in BLE. Normal gross sensation  Psychiatric:        Mood and Affect: Mood is not anxious.     ED Results / Procedures / Treatments   Labs (all labs ordered are listed, but only abnormal results are displayed) Labs Reviewed - No data to display  EKG None  Radiology DG Lumbar Spine Complete  Result Date: 12/19/2020 CLINICAL DATA:  Back pain for 2 weeks.  No known injury. EXAM: LUMBAR SPINE - COMPLETE 4+ VIEW COMPARISON:  CT of the pelvis  from October 01, 2018. FINDINGS: Mild loss of height of the L1 and L2 vertebral bodies potentially with compression of the superior endplate of L2 in particular. No comparison is available. There are extensive spinal degenerative changes  with degenerative changes also involving prominent posterior elements. Degenerative changes greatest at L4-5 and L5-S1 with disc space narrowing and facet arthropathy. Large anterior osteophytes are present greatest at L2-3 and L3-4. IMPRESSION: 1. Mild loss of height of the L1 and L2 vertebral bodies potentially with compression of the superior endplate of L2 in particular. Correlate with any pain in this area, findings are age indeterminate without comparison. 2. Extensive spinal degenerative changes worse in the lower lumbar spine and lumbosacral junction. Electronically Signed   By: Zetta Bills M.D.   On: 12/19/2020 12:33    Procedures Procedures   Medications Ordered in ED Medications  acetaminophen (TYLENOL) tablet 650 mg (650 mg Oral Given 12/19/20 1207)    ED Course  I have reviewed the triage vital signs and the nursing notes.  Pertinent labs & imaging results that were available during my care of the patient were reviewed by me and considered in my medical decision making (see chart for details).    MDM Rules/Calculators/A&P                          Patient's x-rays have been reviewed.  It appears these L1 and L2 fractures are likely the cause of his pain.  His pain is especially worse when he tries to sit up.  However he is able to stand and feels comfortable going home with pain control.  He would like to take Tylenol but nothing stronger.  I did discuss with Dr. Reatha Armour to see if bracing would be helpful or hurtful.  He recommends bracing if the patient would like, but on further talking to the patient he does not want to use a brace.  Otherwise, he is showing no signs of spinal cord compression.  No abdominal complaints.  Will discharge home with  return precautions. Final Clinical Impression(s) / ED Diagnoses Final diagnoses:  Closed compression fracture of body of L1 vertebra (HCC)  Closed compression fracture of L2 vertebra, initial encounter [facility])    Rx / DC Orders ED Discharge Orders    None       Sherwood Gambler, MD 12/19/20 1348

## 2020-12-19 NOTE — ED Notes (Signed)
Patient returned from CT

## 2020-12-19 NOTE — ED Triage Notes (Signed)
Lower back pain for 2 weeks.  Denies any injury.

## 2020-12-19 NOTE — Discharge Instructions (Signed)
If you develop worsening, recurrent, or continued back pain, numbness or weakness in the legs, incontinence of your bowels or bladders, numbness of your buttocks, fever, abdominal pain, or any other new/concerning symptoms then return to the ER for evaluation.   You may take Tylenol 650 mg 4 times per day for pain.

## 2020-12-19 NOTE — ED Notes (Signed)
ED Provider at bedside. 

## 2020-12-19 NOTE — Telephone Encounter (Signed)
Patient called stating he was wanting to be seen by doctor today. I told him doctor was attending at the hospital. Patient states the nurse at the [facility] is wanting him to go to [facility] due to him having awful back pains and that it could possibly be kidneys or lower Gi, I told him I would send the message back to the doctor, but patient is going to ED this morning. Thanks!

## 2020-12-24 NOTE — Telephone Encounter (Signed)
Patient went to ED on 12-19-20.  Trayven Lumadue,CMA

## 2021-03-14 ENCOUNTER — Other Ambulatory Visit: Payer: Self-pay | Admitting: Family Medicine

## 2021-03-14 NOTE — Telephone Encounter (Signed)
Hey team, please contact this patient and help schedule follow-up appointment with me. Last seen in Oct 2021. Will not continue to refill his meds without follow-up.

## 2021-03-29 ENCOUNTER — Encounter: Payer: Self-pay | Admitting: Family Medicine

## 2021-03-29 ENCOUNTER — Other Ambulatory Visit: Payer: Self-pay

## 2021-03-29 ENCOUNTER — Ambulatory Visit (INDEPENDENT_AMBULATORY_CARE_PROVIDER_SITE_OTHER): Payer: Medicare HMO | Admitting: Family Medicine

## 2021-03-29 ENCOUNTER — Telehealth: Payer: Self-pay | Admitting: Family Medicine

## 2021-03-29 VITALS — BP 123/75 | HR 93 | Ht 73.0 in | Wt 168.6 lb

## 2021-03-29 DIAGNOSIS — I4891 Unspecified atrial fibrillation: Secondary | ICD-10-CM | POA: Diagnosis not present

## 2021-03-29 DIAGNOSIS — E785 Hyperlipidemia, unspecified: Secondary | ICD-10-CM

## 2021-03-29 DIAGNOSIS — Z0000 Encounter for general adult medical examination without abnormal findings: Secondary | ICD-10-CM | POA: Diagnosis not present

## 2021-03-29 DIAGNOSIS — I25810 Atherosclerosis of coronary artery bypass graft(s) without angina pectoris: Secondary | ICD-10-CM | POA: Diagnosis not present

## 2021-03-29 DIAGNOSIS — I480 Paroxysmal atrial fibrillation: Secondary | ICD-10-CM

## 2021-03-29 DIAGNOSIS — D649 Anemia, unspecified: Secondary | ICD-10-CM

## 2021-03-29 DIAGNOSIS — I10 Essential (primary) hypertension: Secondary | ICD-10-CM

## 2021-03-29 NOTE — Assessment & Plan Note (Signed)
FLP checked. 

## 2021-03-29 NOTE — Addendum Note (Signed)
Addended by: Andrena Mews T on: 03/29/2021 02:01 PM   Modules accepted: Orders

## 2021-03-29 NOTE — Assessment & Plan Note (Signed)
On Eliquis and Statin. FLP and Cmet checked today.

## 2021-03-29 NOTE — Telephone Encounter (Signed)
I called and informed Brian Manning that I had his DNR order form (yellow form). I discussed the risk and benefits of chest compression and intubation in the case of a cardiac arrest or respiratory failure. He stated that he is aware he could die if he is in respiratory failure or has a cardiac arrest without intervention. He verbally confirmed that he does not want to be resuscitated in any way. I asked if I could call his son to confirm and have him pick up the form. He said that would not be necessary as he is out of town. He gave me his daughter's name and phone number to call instead: Brian Manning - [phone] I discussed the above with [facility]. She also stated that I don't need to contact her brother, who is already aware of his DNR/DNI status.  Form completed and placed up front for pickup - copy on file.

## 2021-03-29 NOTE — Assessment & Plan Note (Signed)
Wants to get of Eliquis. Will discuss with Card. Referral made.

## 2021-03-29 NOTE — Assessment & Plan Note (Signed)
Stable BP

## 2021-03-29 NOTE — Progress Notes (Signed)
Subjective:     Brian Manning is a 87 y.o. male and is here for a comprehensive physical exam. The patient reports problems - Wound on the dorsum of his left had after a scrap against the door at his new assisted living home. It bleeds intermittently. He wants to know if he should stop his Eliquis.  Also asked about obtain a red placard for DNI/DNR status to put in his room. Him and his daughter stated that they don't want any intervention if he is unable to breath on his own.  His son has his healthcare power of attorney.   He need a refill of all his medications. He is otherwise, doing well.  Social History   Socioeconomic History   Marital status: Divorced    Spouse name: Not on file   Number of children: Not on file   Years of education: Not on file   Highest education level: Not on file  Occupational History   Occupation: retired  Tobacco Use   Smoking status: Never   Smokeless tobacco: Never  Vaping Use   Vaping Use: Never used  Substance and Sexual Activity   Alcohol use: Not Currently    Alcohol/week: 1.0 standard drink    Types: 1 Shots of liquor per week    Comment: Less than 1 drink per week   Drug use: No   Sexual activity: Never  Other Topics Concern   Not on file  Social History Narrative   Retired Engineer, maintenance (IT).  Divorced- ex wife now passed away.  Lives alone- has two children in [place].  College graduate.   Social Determinants of Health   Financial Resource Strain: Not on file  Food Insecurity: Not on file  Transportation Needs: Not on file  Physical Activity: Not on file  Stress: Not on file  Social Connections: Not on file  Intimate Partner Violence: Not on file   Health Maintenance  Topic Date Due   Zoster Vaccines- Shingrix (1 of 2) Never done   INFLUENZA VACCINE  04/01/2021   TETANUS/TDAP  03/22/2022   COVID-19 Vaccine  Completed   PNA vac Low Risk Adult  Completed   HPV VACCINES  Aged Out    The following portions of the  patient's history were reviewed and updated as appropriate: allergies, current medications, past family history, past medical history, past social history, past surgical history, and problem list.  Review of Systems Pertinent items are noted in HPI.   Objective:    BP 123/75   Pulse 93   Ht '6\' 1"'$  (1.854 m)   Wt 168 lb 9.6 oz (76.5 kg)   SpO2 99%   BMI 22.24 kg/m  General appearance: alert and cooperative Head: Normocephalic, without obvious abnormality, atraumatic Eyes: conjunctivae/corneas clear. PERRL, EOM's intact. Fundi benign. Ears: normal TM's and external ear canals both ears Throat: lips, mucosa, and tongue normal; teeth and gums normal Lungs: clear to auscultation bilaterally Heart: regular rate and rhythm, S1, S2 normal, no murmur, click, rub or gallop and Regularly irregular with occasional skip beat. Abdomen: soft, non-tender; bowel sounds normal; no masses,  no organomegaly Extremities: extremities normal, atraumatic, no cyanosis or edema and See skin below Pulses: 2+ and symmetric Palpable Skin: Skin color, texture, turgor normal. No rashes or lesions or skin abrasion of the dorsum of his left hand, bled when the tap covering was removed. Neurologic: Oriented to person, place and time. Gait not assessed. Power 4/5 globally, no sensory loss.   Assessment:  Healthy male exam.  Atrial fibrillation    Left hand wound Plan:  Vitals looks good. Health for his age. Shingrix recommended - he deferred it for now. Hard of hearing and was not wearing his hearing aide today. Daughter helped a lot with communication. Risk and benefit of Eliquis discussed with him and his daughter. He will continue for now pending discussion with cardiologist. He last saw cards 5 yrs ago - will place referral (virtual visit preferred) Left wound dressed with bacitracin ointment and gauze. Keep clean and dry. F/U soon if worsening. FLP and Cmet checked for HLD and HTN Also checked CBC  panel for anemia.  DNR red card: As discussed with them, I will need to investigate this request and get back to them soon.   See After Visit Summary for Counseling Recommendations

## 2021-03-29 NOTE — Patient Instructions (Signed)
Preventive Care 85 Years and Older, Male Preventive care refers to lifestyle choices and visits with your health care provider that can promote health and wellness. This includes: A yearly physical exam. This is also called an annual wellness visit. Regular dental and eye exams. Immunizations. Screening for certain conditions. Healthy lifestyle choices, such as: Eating a healthy diet. Getting regular exercise. Not using drugs or products that contain nicotine and tobacco. Limiting alcohol use. What can I expect for my preventive care visit? Physical exam Your health care provider will check your: Height and weight. These may be used to calculate your BMI (body mass index). BMI is a measurement that tells if you are at a healthy weight. Heart rate and blood pressure. Body temperature. Skin for abnormal spots. Counseling Your health care provider may ask you questions about your: Past medical problems. Family's medical history. Alcohol, tobacco, and drug use. Emotional well-being. Home life and relationship well-being. Sexual activity. Diet, exercise, and sleep habits. History of falls. Memory and ability to understand (cognition). Work and work environment. Access to firearms. What immunizations do I need?  Vaccines are usually given at various ages, according to a schedule. Your health care provider will recommend vaccines for you based on your age, medicalhistory, and lifestyle or other factors, such as travel or where you work. What tests do I need? Blood tests Lipid and cholesterol levels. These may be checked every 5 years, or more often depending on your overall health. Hepatitis C test. Hepatitis B test. Screening Lung cancer screening. You may have this screening every year starting at age 55 if you have a 30-pack-year history of smoking and currently smoke or have quit within the past 15 years. Colorectal cancer screening. All adults should have this screening  starting at age 50 and continuing until age 75. Your health care provider may recommend screening at age 45 if you are at increased risk. You will have tests every 1-10 years, depending on your results and the type of screening test. Prostate cancer screening. Recommendations will vary depending on your family history and other risks. Genital exam to check for testicular cancer or hernias. Diabetes screening. This is done by checking your blood sugar (glucose) after you have not eaten for a while (fasting). You may have this done every 1-3 years. Abdominal aortic aneurysm (AAA) screening. You may need this if you are a current or former smoker. STD (sexually transmitted disease) testing, if you are at risk. Follow these instructions at home: Eating and drinking  Eat a diet that includes fresh fruits and vegetables, whole grains, lean protein, and low-fat dairy products. Limit your intake of foods with high amounts of sugar, saturated fats, and salt. Take vitamin and mineral supplements as recommended by your health care provider. Do not drink alcohol if your health care provider tells you not to drink. If you drink alcohol: Limit how much you have to 0-2 drinks a day. Be aware of how much alcohol is in your drink. In the [place] one drink equals one 12 oz bottle of beer (355 mL), one 5 oz glass of wine (148 mL), or one 1 oz glass of hard liquor (44 mL).  Lifestyle Take daily care of your teeth and gums. Brush your teeth every morning and night with fluoride toothpaste. Floss one time each day. Stay active. Exercise for at least 30 minutes 5 or more days each week. Do not use any products that contain nicotine or tobacco, such as cigarettes, e-cigarettes, and chewing tobacco.   If you need help quitting, ask your health care provider. Do not use drugs. If you are sexually active, practice safe sex. Use a condom or other form of protection to prevent STIs (sexually transmitted infections). Talk  with your health care provider about taking a low-dose aspirin or statin. Find healthy ways to cope with stress, such as: Meditation, yoga, or listening to music. Journaling. Talking to a trusted person. Spending time with friends and family. Safety Always wear your seat belt while driving or riding in a vehicle. Do not drive: If you have been drinking alcohol. Do not ride with someone who has been drinking. When you are tired or distracted. While texting. Wear a helmet and other protective equipment during sports activities. If you have firearms in your house, make sure you follow all gun safety procedures. What's next? Visit your health care provider once a year for an annual wellness visit. Ask your health care provider how often you should have your eyes and teeth checked. Stay up to date on all vaccines. This information is not intended to replace advice given to you by your health care provider. Make sure you discuss any questions you have with your healthcare provider. Document Revised: 05/17/2019 Document Reviewed: 08/12/2018 Elsevier Patient Education  2022 [employer]  

## 2021-03-30 ENCOUNTER — Telehealth: Payer: Self-pay | Admitting: Family Medicine

## 2021-03-30 LAB — LIPID PANEL
Chol/HDL Ratio: 2.1 ratio (ref 0.0–5.0)
Cholesterol, Total: 109 mg/dL (ref 100–199)
HDL: 52 mg/dL (ref >39)
LDL Chol Calc (NIH): 44 mg/dL (ref 0–99)
Triglycerides: 54 mg/dL (ref 0–149)
VLDL Cholesterol Cal: 13 mg/dL (ref 5–40)

## 2021-03-30 LAB — ANEMIA PROFILE B
Basophils Absolute: 0 10*3/uL (ref 0.0–0.2)
Basos: 1 %
EOS (ABSOLUTE): 0 10*3/uL (ref 0.0–0.4)
Eos: 1 %
Ferritin: 137 ng/mL (ref 30–400)
Folate: >20 ng/mL (ref >3.0)
Hematocrit: 40.6 % (ref 37.5–51.0)
Hemoglobin: 13.9 g/dL (ref 13.0–17.7)
Immature Grans (Abs): 0 10*3/uL (ref 0.0–0.1)
Immature Granulocytes: 0 %
Iron Saturation: 38 % (ref 15–55)
Iron: 99 ug/dL (ref 38–169)
Lymphocytes Absolute: 1 10*3/uL (ref 0.7–3.1)
Lymphs: 22 %
MCH: 32.3 pg (ref 26.6–33.0)
MCHC: 34.2 g/dL (ref 31.5–35.7)
MCV: 94 fL (ref 79–97)
Monocytes Absolute: 0.6 10*3/uL (ref 0.1–0.9)
Monocytes: 14 %
Neutrophils Absolute: 2.7 10*3/uL (ref 1.4–7.0)
Neutrophils: 62 %
Platelets: 179 10*3/uL (ref 150–450)
RBC: 4.3 x10E6/uL (ref 4.14–5.80)
RDW: 12.6 % (ref 11.6–15.4)
Retic Ct Pct: 1 % (ref 0.6–2.6)
Total Iron Binding Capacity: 259 ug/dL (ref 250–450)
UIBC: 160 ug/dL (ref 111–343)
Vitamin B-12: 441 pg/mL (ref 232–1245)
WBC: 4.4 10*3/uL (ref 3.4–10.8)

## 2021-03-30 LAB — CMP14+EGFR
ALT: 15 IU/L (ref 0–44)
AST: 17 IU/L (ref 0–40)
Albumin/Globulin Ratio: 1.8 (ref 1.2–2.2)
Albumin: 4.3 g/dL (ref 3.5–4.6)
Alkaline Phosphatase: 65 IU/L (ref 44–121)
BUN/Creatinine Ratio: 24 (ref 10–24)
BUN: 24 mg/dL (ref 10–36)
Bilirubin Total: 0.7 mg/dL (ref 0.0–1.2)
CO2: 24 mmol/L (ref 20–29)
Calcium: 9.5 mg/dL (ref 8.6–10.2)
Chloride: 97 mmol/L (ref 96–106)
Creatinine, Ser: 0.99 mg/dL (ref 0.76–1.27)
Globulin, Total: 2.4 g/dL (ref 1.5–4.5)
Glucose: 97 mg/dL (ref 65–99)
Potassium: 4.7 mmol/L (ref 3.5–5.2)
Sodium: 137 mmol/L (ref 134–144)
Total Protein: 6.7 g/dL (ref 6.0–8.5)
eGFR: 72 mL/min/{1.73_m2} (ref >59)

## 2021-03-30 NOTE — Telephone Encounter (Signed)
HIPAA compliant callback message let.   Please advise when he calls:  All labs are normal. He can discontinue Folic acid supplement.

## 2021-04-01 ENCOUNTER — Telehealth: Payer: Self-pay | Admitting: Family Medicine

## 2021-04-01 NOTE — Telephone Encounter (Signed)
Per request from patient any results he doesn't want any phone calls.  Requests this information to be mailed to his home. Address on file [address] [place].

## 2021-04-02 NOTE — Telephone Encounter (Signed)
Routed to PCP. Nobuo Nunziata, CMA  

## 2021-04-03 ENCOUNTER — Encounter: Payer: Self-pay | Admitting: Family Medicine

## 2021-04-03 NOTE — Telephone Encounter (Signed)
Result letter created and sent to his home address as requested.

## 2021-06-14 DIAGNOSIS — Z23 Encounter for immunization: Secondary | ICD-10-CM | POA: Diagnosis not present

## 2021-06-20 ENCOUNTER — Ambulatory Visit: Payer: Non-veteran care | Admitting: Cardiology

## 2022-01-08 ENCOUNTER — Encounter: Payer: Self-pay | Admitting: Family Medicine

## 2022-01-08 NOTE — Progress Notes (Addendum)
I received a dead certificate completion request for this patient. However, just before I went in to start processing it, I received another email, that the request had been pulled. The front office staff also received a call regarding death completion. ?I called and spoke with his son - Daril Warga. He confirmed that his father passed away naturally at his home in [place] on 01/07/22 at about 2:22 PM. He was not ill prior to this event. He was found on the floor at home.  ? ?Of note, I have not been able to pull the case from [facility] website using the case ID# provided. ? ?....................................................................................................................... ?I called the funeral home and I was eventually able to complete his death cert.  ?

## 2022-01-30 DEATH — deceased
